# Patient Record
Sex: Female | Born: 1945 | Race: White | Hispanic: No | Marital: Married | Smoking: Never smoker
Health system: Southern US, Community
[De-identification: ages and names within clinical notes are randomized; demographics above are authoritative.]

## PROBLEM LIST (undated history)

## (undated) DIAGNOSIS — J45909 Unspecified asthma, uncomplicated: Secondary | ICD-10-CM

## (undated) DIAGNOSIS — J309 Allergic rhinitis, unspecified: Secondary | ICD-10-CM

## (undated) DIAGNOSIS — I447 Left bundle-branch block, unspecified: Secondary | ICD-10-CM

## (undated) DIAGNOSIS — G4730 Sleep apnea, unspecified: Secondary | ICD-10-CM

## (undated) DIAGNOSIS — K429 Umbilical hernia without obstruction or gangrene: Secondary | ICD-10-CM

## (undated) DIAGNOSIS — J449 Chronic obstructive pulmonary disease, unspecified: Secondary | ICD-10-CM

## (undated) DIAGNOSIS — M109 Gout, unspecified: Secondary | ICD-10-CM

## (undated) HISTORY — PX: ROOT CANAL: SHX2363

## (undated) HISTORY — PX: WISDOM TOOTH EXTRACTION: SHX21

## (undated) HISTORY — PX: BREAST LUMPECTOMY: SHX2

## (undated) HISTORY — PX: SKIN BIOPSY: SHX1

## (undated) HISTORY — PX: CARDIAC CATHETERIZATION: SHX172

## (undated) HISTORY — PX: TONSILLECTOMY: SUR1361

## (undated) HISTORY — PX: ADENOIDECTOMY: SUR15

## (undated) HISTORY — PX: CATARACT EXTRACTION, BILATERAL: SHX1313

---

## 2003-07-22 DIAGNOSIS — I447 Left bundle-branch block, unspecified: Secondary | ICD-10-CM | POA: Insufficient documentation

## 2011-02-07 ENCOUNTER — Other Ambulatory Visit: Payer: Self-pay | Admitting: Family Medicine

## 2011-02-07 ENCOUNTER — Ambulatory Visit
Admission: RE | Admit: 2011-02-07 | Discharge: 2011-02-07 | Disposition: A | Discharge status: Home / Self Care | Payer: Medicare Other | Source: Ambulatory Visit | Attending: Family Medicine | Admitting: Family Medicine

## 2011-02-07 DIAGNOSIS — R609 Edema, unspecified: Secondary | ICD-10-CM

## 2011-02-07 DIAGNOSIS — R52 Pain, unspecified: Secondary | ICD-10-CM

## 2011-07-08 DIAGNOSIS — M660 Rupture of popliteal cyst: Secondary | ICD-10-CM | POA: Insufficient documentation

## 2011-07-08 DIAGNOSIS — M79604 Pain in right leg: Secondary | ICD-10-CM | POA: Insufficient documentation

## 2011-07-13 DIAGNOSIS — L03115 Cellulitis of right lower limb: Secondary | ICD-10-CM | POA: Insufficient documentation

## 2011-07-18 DIAGNOSIS — M1990 Unspecified osteoarthritis, unspecified site: Secondary | ICD-10-CM | POA: Insufficient documentation

## 2011-08-12 DIAGNOSIS — Z01419 Encounter for gynecological examination (general) (routine) without abnormal findings: Secondary | ICD-10-CM | POA: Insufficient documentation

## 2011-08-12 DIAGNOSIS — G2581 Restless legs syndrome: Secondary | ICD-10-CM | POA: Insufficient documentation

## 2011-08-12 DIAGNOSIS — N3281 Overactive bladder: Secondary | ICD-10-CM | POA: Insufficient documentation

## 2012-07-27 DIAGNOSIS — H35379 Puckering of macula, unspecified eye: Secondary | ICD-10-CM | POA: Insufficient documentation

## 2012-07-27 DIAGNOSIS — H40019 Open angle with borderline findings, low risk, unspecified eye: Secondary | ICD-10-CM | POA: Insufficient documentation

## 2014-03-17 DIAGNOSIS — J4530 Mild persistent asthma, uncomplicated: Secondary | ICD-10-CM | POA: Insufficient documentation

## 2014-03-17 DIAGNOSIS — M109 Gout, unspecified: Secondary | ICD-10-CM | POA: Insufficient documentation

## 2014-03-17 DIAGNOSIS — E039 Hypothyroidism, unspecified: Secondary | ICD-10-CM | POA: Insufficient documentation

## 2015-12-12 DIAGNOSIS — I2510 Atherosclerotic heart disease of native coronary artery without angina pectoris: Secondary | ICD-10-CM | POA: Insufficient documentation

## 2015-12-12 DIAGNOSIS — E782 Mixed hyperlipidemia: Secondary | ICD-10-CM | POA: Insufficient documentation

## 2016-08-07 ENCOUNTER — Ambulatory Visit (INDEPENDENT_AMBULATORY_CARE_PROVIDER_SITE_OTHER): Payer: Medicare Other

## 2016-08-07 ENCOUNTER — Ambulatory Visit (INDEPENDENT_AMBULATORY_CARE_PROVIDER_SITE_OTHER): Payer: Medicare Other | Admitting: Podiatry

## 2016-08-07 ENCOUNTER — Encounter: Payer: Self-pay | Admitting: Podiatry

## 2016-08-07 DIAGNOSIS — M779 Enthesopathy, unspecified: Secondary | ICD-10-CM

## 2016-08-07 DIAGNOSIS — R52 Pain, unspecified: Secondary | ICD-10-CM | POA: Diagnosis not present

## 2016-08-07 MED ORDER — MELOXICAM 15 MG PO TABS: 15.0000 mg | ORAL_TABLET | Freq: Every day | ORAL | 0 refills | Status: DC

## 2016-08-07 NOTE — Progress Notes (Signed)
   Subjective:    Patient ID: Diana GurneySusan [name], female    DOB: October 26, 1945, 71 y.o.   MRN: [id number]  HPI this patient presents the office with chief complaint of pain on the outside ball of her left foot.  She says that she experiences severe pain on the outside of her left foot and at other times no pain is noted.  Examination of her left foot reveals no palpable pain or swelling noted, but there is a bony prominence noted at the head of the fifth metatarsal left foot.  Patient does have a medial deviation of all the digits on the left foot.  This patient also had surgery for the correction of the bunion both feet as well as hammertoes of the fifth toes both feet. She presents the office today with concern for the on and off pain that she is experiencing on the outside of her left foot    Review of Systems  HENT: Positive for hearing loss and sinus pressure.   Respiratory: Positive for cough and shortness of breath.   Cardiovascular: Positive for leg swelling.  Musculoskeletal: Positive for back pain and gait problem.       Objective:   Physical Exam GENERAL APPEARANCE: Alert, conversant. Appropriately groomed. No acute distress.  VASCULAR: Pedal pulses are  palpable at  [facility] bilateral.  Capillary refill time is immediate to all digits,  Normal temperature gradient.  Digital hair growth is present bilateral  NEUROLOGIC: sensation is normal to 5.07 monofilament at 5/5 sites bilateral.  Light touch is intact bilateral, Muscle strength normal.  MUSCULOSKELETAL: acceptable muscle strength, tone and stability bilateral.  Intrinsic muscluature intact bilateral. Medial deviation of digits  B/l. Bony prominence fifth metatarsal left foot.  DERMATOLOGIC: skin color, texture, and turgor are within normal limits.  No preulcerative lesions or ulcers  are seen, no interdigital maceration noted.  No open lesions present.  Digital nails are asymptomatic. No drainage noted.         Assessment & Plan:   Capsulitis 5th MPJ  Left foot.  S/P foot surgery  B/L.  Initial exam.  X-rays taken reveal wired noted at the base of the first metatarsal bilaterally.  Mild hallux valgus first MPJ left foot.  Medial deviation of the digits.  Discussed capsulitis with this patient.  Told her that we should prescribe her Mobic to be taken when pain is present.   Told her we can even provide  her injection therapy if the pain is severe at times.  .RTC prn.   Helane GuntherGregory Lisset Ketchem DPM

## 2016-08-09 ENCOUNTER — Encounter: Payer: Self-pay | Admitting: Podiatry

## 2016-10-30 ENCOUNTER — Ambulatory Visit: Payer: Medicare Other | Admitting: Podiatry

## 2017-06-27 ENCOUNTER — Encounter: Payer: Self-pay | Admitting: Internal Medicine

## 2017-06-27 ENCOUNTER — Ambulatory Visit (INDEPENDENT_AMBULATORY_CARE_PROVIDER_SITE_OTHER): Payer: Medicare Other | Admitting: Internal Medicine

## 2017-06-27 VITALS — BP 132/80 | HR 75 | Ht 62.0 in | Wt 210.0 lb

## 2017-06-27 DIAGNOSIS — R062 Wheezing: Secondary | ICD-10-CM

## 2017-06-27 DIAGNOSIS — R0609 Other forms of dyspnea: Secondary | ICD-10-CM

## 2017-06-27 DIAGNOSIS — R0689 Other abnormalities of breathing: Secondary | ICD-10-CM | POA: Diagnosis not present

## 2017-06-27 DIAGNOSIS — Z8669 Personal history of other diseases of the nervous system and sense organs: Secondary | ICD-10-CM

## 2017-06-27 NOTE — Progress Notes (Signed)
Subjective:     Patient ID: Diana [name], female   DOB: [birth year], 72 y.o.   MRN: [id number] PCP Eartha InchBadger, Michael C, MD  HPI   IOV  06/27/2017  Chief Complaint  Patient presents with  . Consult    Referred by Dr. Cyndia BentBadger for asthma.  Pt states she has been diagnosed with asthma in 2005 and also has allergies and was diagnosed with 1984. Pt denies any current complaints of cough, SOB, or CP. States she recently moved from [place] and needs to be established with a new Pulmonologist.   Diana [name] , 72 y.o. , with dob [birth year] and female ,Unavailable from [address] [place] [id number] - presents to pulm clinic for establishing care for asthma after moving here from [place], 72 year old female here with her husband.  She is here for evaluation of noisy breathing, shortness of breath with exertion and wheezing with a background history of asthma and allergy dating back decades.  History is given by her and by her husband and to a limited extent by review of the old chart.  And as best as I can gather she is to live in [place] up until 2012 but back in the 1970s and 1980s she used to also live in [place] and then shifted back and forth between [place] to [place].  In 1984 she was diagnosed to have significant allergies to different agents known food.  She then set up with her allergist and was on multiple allergy shots but she used to carry with her given her trip to [place].  While in [place] she would visit with Dr. Felipe DroneJos Bardelas and get shots.  In 2006 she was referred to a pulmonologist who diagnosed her with sleep apnea and started on CPAP which was then changed to BiPAP in 2010.  In 2012 she moved from [place] and he has not followed with an allergist until recently in April 2019 when she registered with Dr. Skyland CallasSharma in the interim in 2014 she did stop her allergy shots.  Most recently when she saw Dr. [place] CallasSharma allergy test was  essentially negative except 2+ for cat and since then she has been on cat allergy shots.  Her medication profile indicates she has been on theophylline for many decades which she is unwilling to quit.  She is been on Symbicort for many years and more recently since April 2019 on Singulair, cat allergy shot, Flonase and prednisone as needed.  According to her husband the main issue is her dyspnea on exertion when she climbs a flight of stairs noisy breathing and wheezing.  These are multi-decade problems with her husband feels like over time gradually they are getting better but still unresolved.  They do need to establish with a primary pulmonologist in the area and also sleep doctor and that 1 of the main purposes of the visit today.  She also has nonspecific inability to stand for a long time because her legs give out.  But she is okay walking apparently.  In terms of her asthma control questionnaire symptoms she does not wake up in the middle of the night with symptoms when she wakes up in the morning she does not have any symptoms her activities only slightly limited because of asthma.  She experience a little shortness of breath and she wheezes a moderate amount of the time and does not use albuterol for rescue.       feno -  unable to do   has no past medical history on file.   reports that she has never smoked. She has never used smokeless tobacco.  The histories are not reviewed yet. Please review them in the "History" navigator section and refresh this SmartLink.  Allergies  Allergen Reactions  . Molds & Smuts Shortness Of Breath  . Other Shortness Of Breath  . Pollen Extract Shortness Of Breath    Immunization History  Administered Date(s) Administered  . Influenza, High Dose Seasonal PF 11/04/2016  . Pneumococcal Conjugate-13 12/25/2012  . Pneumococcal Polysaccharide-23 11/22/2014    No family history on file.   Current Outpatient Medications:  .  albuterol (ACCUNEB)  1.25 MG/3ML nebulizer solution, Take 1 ampule by nebulization as needed for wheezing., Disp: , Rfl:  .  albuterol (PROVENTIL HFA;VENTOLIN HFA) 108 (90 Base) MCG/ACT inhaler, Inhale into the lungs as needed for wheezing or shortness of breath., Disp: , Rfl:  .  albuterol (PROVENTIL) (2.5 MG/3ML) 0.083% nebulizer solution, Take 3 mLs (2.5 mg total) by nebulization every 4 (four) hours as needed for Wheezing., Disp: , Rfl:  .  allopurinol (ZYLOPRIM) 300 MG tablet, , Disp: , Rfl: 3 .  aspirin EC 81 MG tablet, Take by mouth., Disp: , Rfl:  .  B Complex Vitamins (VITAMIN B COMPLEX PO), Take by mouth., Disp: , Rfl:  .  Cholecalciferol (VITAMIN D3) 1000 units CAPS, Take by mouth., Disp: , Rfl:  .  diazepam (VALIUM) 5 MG tablet, TAKE 1 TABLET BY MOUTH EVERY DAY, Disp: , Rfl:  .  diphenhydrAMINE (BENADRYL) 25 MG tablet, Take 25 mg by mouth., Disp: , Rfl:  .  EPINEPHrine 0.3 mg/0.3 mL IJ SOAJ injection, Inject into the skin., Disp: , Rfl:  .  folic acid (FOLVITE) 400 MCG tablet, Take by mouth., Disp: , Rfl:  .  furosemide (LASIX) 20 MG tablet, TK 1 T PO D, Disp: , Rfl: 0 .  levothyroxine (SYNTHROID, LEVOTHROID) 50 MCG tablet, Take 50 mcg by mouth 1 day or 1 dose., Disp: , Rfl:  .  montelukast (SINGULAIR) 10 MG tablet, TK 1 T PO QD, Disp: , Rfl: 3 .  Multiple Vitamin (MULTIVITAMIN) tablet, Take by mouth., Disp: , Rfl:  .  niacin 500 MG tablet, Take 500 mg by mouth 1 day or 1 dose., Disp: , Rfl:  .  potassium chloride SA (K-DUR,KLOR-CON) 20 MEQ tablet, TK 1 T PO QD, Disp: , Rfl: 3 .  predniSONE 5 MG/5ML solution, Take 5 mg by mouth as needed., Disp: , Rfl:  .  simvastatin (ZOCOR) 20 MG tablet, TK 1 T PO AT BEDTIME, Disp: , Rfl: 3 .  SYMBICORT 160-4.5 MCG/ACT inhaler, INL 2 PFS ITL BID, Disp: , Rfl: 12 .  theophylline (UNIPHYL) 400 MG 24 hr tablet, Take 400 mg by mouth daily., Disp: , Rfl:  .  vitamin E 1000 UNIT capsule, Take by mouth., Disp: , Rfl:    Review of Systems     Objective:   Physical Exam   Constitutional: She is oriented to person, place, and time. She appears well-developed and well-nourished. No distress.  Body mass index is 38.41 kg/m.   HENT:  Head: Normocephalic and atraumatic.  Right Ear: External ear normal.  Left Ear: External ear normal.  Mouth/Throat: Oropharynx is clear and moist. No oropharyngeal exudate.  Has hearing aids  Eyes: Pupils are equal, round, and reactive to light. Conjunctivae and EOM are normal. Right eye exhibits no discharge. Left eye exhibits no discharge. No scleral  icterus.  Neck: Normal range of motion. Neck supple. No JVD present. No tracheal deviation present. No thyromegaly present.  Cardiovascular: Normal rate, regular rhythm, normal heart sounds and intact distal pulses. Exam reveals no gallop and no friction rub.  No murmur heard. Pulmonary/Chest: Effort normal and breath sounds normal. No respiratory distress. She has no wheezes. She has no rales. She exhibits no tenderness.  Abdominal: Soft. Bowel sounds are normal. She exhibits no distension and no mass. There is no tenderness. There is no rebound and no guarding.  Visceral obesity +  Musculoskeletal: Normal range of motion. She exhibits no edema or tenderness.  Lymphadenopathy:    She has no cervical adenopathy.  Neurological: She is alert and oriented to person, place, and time. She has normal reflexes. No cranial nerve deficit. She exhibits normal muscle tone. Coordination normal.  Skin: Skin is warm and dry. No rash noted. She is not diaphoretic. No erythema. No pallor.  Psychiatric: She has a normal mood and affect. Her behavior is normal. Judgment and thought content normal.  Vitals reviewed.  Today's Vitals   06/27/17 1418  BP: 132/80  Pulse: 75  SpO2: 93%    There is no height or weight on file to calculate BMI.     Assessment:       ICD-10-CM   1. Dyspnea on exertion R06.09   2. Wheezing R06.2   3. Noisy breathing R06.89   4. History of obstructive sleep apnea  Z86.69     She is very reluctant to stop her theophylline.  Regardless I would like to approach her problems from a symptom standpoint and try to reverse engineer and work as to why she has symptoms of shortness of breath, noisy breathing and wheezing taking into account the background history of asthma and allergies.  She has had complete allergy testing recently with Dr. [place] Callas so there is no need for me to repeat this.  Differential diagnosis for shortness of breath at this stage includes obesity with physical deconditioning and diastolic dysfunction with or without asthma.  She would also have cor pulmonale although I do not see edema on account of obesity and sleep apnea.  We can begin with high-resolution CT scan and pulmonary function test and also evaluation by sleep physician.  She is willing for this.     Plan:      Dyspnea on exertion Wheezing Noisy breathing  - do HRCT supine and prone - do full PFT  History of obstructive sleep apnea - refer sleep doc in our practice or Dr Dohmeier or Dr Mayford Knife - whoever can see you soon  Followuop - Dr Marchelle Gearing or an APP in 2-8 weeks but after completing above     Dr. Kalman Shan, M.D., [facility].C.P Pulmonary and Critical Care Medicine Staff Physician, [facility] Center Director - Interstitial Lung Disease  Program  Pulmonary Fibrosis [facility] at [facility] [place], [place], [address]  Pager: [phone], If no answer or between  15:00h - 7:00h: call [phone] Telephone: [phone]

## 2017-06-27 NOTE — Progress Notes (Signed)
   Subjective:    Patient ID: Diana GurneySusan [name], female    DOB: [birth year], 72 y.o.   MRN: [id number]  HPI    Review of Systems  Constitutional: Negative for fever and unexpected weight change.  HENT: Positive for sneezing and sore throat. Negative for congestion, dental problem, ear pain, nosebleeds, postnasal drip, rhinorrhea and trouble swallowing.   Eyes: Negative for redness and itching.  Respiratory: Positive for cough, shortness of breath and wheezing. Negative for chest tightness.   Cardiovascular: Positive for leg swelling. Negative for palpitations.  Gastrointestinal: Negative for nausea and vomiting.  Genitourinary: Negative for dysuria.  Musculoskeletal: Positive for joint swelling.  Skin: Negative for rash.  Allergic/Immunologic: Positive for environmental allergies. Negative for food allergies and immunocompromised state.  Neurological: Negative for headaches.  Hematological: Does not bruise/bleed easily.  Psychiatric/Behavioral: Negative for dysphoric mood. The patient is not nervous/anxious.        Objective:   Physical Exam        Assessment & Plan:

## 2017-06-27 NOTE — Patient Instructions (Signed)
ICD-10-CM   1. Dyspnea on exertion R06.09   2. Wheezing R06.2   3. Noisy breathing R06.89   4. History of obstructive sleep apnea Z86.69      Dyspnea on exertion Wheezing Noisy breathing  - do HRCT supine and prone - do full PFT  History of obstructive sleep apnea - refer sleep doc in our practice or Dr Dohmeier or Dr Mayford Knifeurner - whoever can see you soon  Followuop - Dr Marchelle Gearingamaswamy or an APP in 2-8 weeks but after completing above

## 2017-06-29 ENCOUNTER — Encounter: Payer: Self-pay | Admitting: Internal Medicine

## 2017-06-30 ENCOUNTER — Ambulatory Visit (INDEPENDENT_AMBULATORY_CARE_PROVIDER_SITE_OTHER): Payer: Medicare Other | Admitting: Internal Medicine

## 2017-06-30 ENCOUNTER — Encounter: Payer: Self-pay | Admitting: Internal Medicine

## 2017-06-30 VITALS — BP 126/80 | HR 65 | Ht 62.0 in | Wt 211.0 lb

## 2017-06-30 DIAGNOSIS — F5101 Primary insomnia: Secondary | ICD-10-CM

## 2017-06-30 DIAGNOSIS — G4733 Obstructive sleep apnea (adult) (pediatric): Secondary | ICD-10-CM | POA: Diagnosis not present

## 2017-06-30 DIAGNOSIS — G4700 Insomnia, unspecified: Secondary | ICD-10-CM | POA: Insufficient documentation

## 2017-06-30 NOTE — Patient Instructions (Signed)
Order- schedule split protocol NPSG with CPAP/BIPAP titration    Dx OSA  Please call as needed

## 2017-06-30 NOTE — Progress Notes (Signed)
HPI  06/30/2017-72 year old female never smoker for sleep evaluation.  Medical problem list includes asthma( Dr Marchelle Gearing), allergic rhinitis( Dr Ingram Callas). ----Sleep consult: Pt needs to establish with sleep MD and DME locally-moved here from [place]. DL attached.  Regional sleep study was in 2006 with subsequent study in 2011 and current machine was new in 2011.  She is very comfortable with BiPAP I 10,  E6, Biflex 3.  Getting supplies from [employer] and using a nasal mask and humidifier. Download 100% compliance AHI 3.1/hour. ENT surgery-tonsils.  Prior to Admission medications   Medication Sig Start Date End Date Taking? Authorizing Provider  albuterol (ACCUNEB) 1.25 MG/3ML nebulizer solution Take 1 ampule by nebulization as needed for wheezing.   Yes [provider]  albuterol (PROVENTIL HFA;VENTOLIN HFA) 108 (90 Base) MCG/ACT inhaler Inhale into the lungs as needed for wheezing or shortness of breath.   Yes [provider]  albuterol (PROVENTIL) (2.5 MG/3ML) 0.083% nebulizer solution Take 3 mLs (2.5 mg total) by nebulization every 4 (four) hours as needed for Wheezing. 06/03/16  Yes [provider]  allopurinol (ZYLOPRIM) 300 MG tablet  06/07/16  Yes [provider]  aspirin EC 81 MG tablet Take by mouth.   Yes [provider]  B Complex Vitamins (VITAMIN B COMPLEX PO) Take by mouth.   Yes [provider]  Cholecalciferol (VITAMIN D3) 1000 units CAPS Take by mouth.   Yes [provider]  diazepam (VALIUM) 5 MG tablet TAKE 1 TABLET BY MOUTH EVERY DAY 06/03/16  Yes [provider]  diphenhydrAMINE (BENADRYL) 25 MG tablet Take 25 mg by mouth.   Yes [provider]  EPINEPHrine 0.3 mg/0.3 mL IJ SOAJ injection Inject into the skin. 01/23/16  Yes [provider]  folic acid (FOLVITE) 400 MCG tablet Take by mouth.   Yes [provider]  furosemide (LASIX) 20 MG tablet TK 1 T PO D 06/03/16  Yes [provider]  levothyroxine (SYNTHROID, LEVOTHROID) 50 MCG tablet Take 50 mcg by mouth 1 day or 1 dose.   Yes [provider]  montelukast (SINGULAIR) 10 MG tablet TK 1 T PO QD 05/14/17  Yes [provider]  Multiple Vitamin (MULTIVITAMIN) tablet Take by mouth.   Yes [provider]  niacin 500 MG tablet Take 500 mg by mouth 1 day or 1 dose.   Yes [provider]  potassium chloride SA (K-DUR,KLOR-CON) 20 MEQ tablet TK 1 T PO QD 06/02/16  Yes [provider]  predniSONE 5 MG/5ML solution Take 5 mg by mouth as needed.   Yes [provider]  simvastatin (ZOCOR) 20 MG tablet TK 1 T PO AT BEDTIME 06/16/16  Yes [provider]  SYMBICORT 160-4.5 MCG/ACT inhaler INL 2 PFS ITL BID 07/09/16  Yes [provider]  theophylline (UNIPHYL) 400 MG 24 hr tablet Take 400 mg by mouth daily.   Yes [provider]  vitamin E 1000 UNIT capsule Take by mouth.   Yes [provider]   History reviewed. No pertinent past medical history. History reviewed. No pertinent surgical history. History reviewed. No pertinent family history. Social History   Socioeconomic History  . Marital status: Married    Spouse name: Not on file  . Number of children: Not on file  . Years of education: Not on file  . Highest education level: Not on file  Occupational History  . Not on file  Social Needs  . Financial resource strain: Not on file  .  Food insecurity:    Worry: Not on file    Inability: Not on file  . Transportation needs:    Medical: Not on file    Non-medical: Not on file  Tobacco Use  . Smoking status: Never Smoker  . Smokeless tobacco: Never Used  Substance and Sexual Activity  . Alcohol use: No  . Drug use: No  . Sexual activity: Not on file  Lifestyle  . Physical activity:    Days per week: Not on file    Minutes per session: Not on file  . Stress: Not on file  Relationships  . Social connections:    Talks on  phone: Not on file    Gets together: Not on file    Attends religious service: Not on file    Active member of club or organization: Not on file    Attends meetings of clubs or organizations: Not on file    Relationship status: Not on file  . Intimate partner violence:    Fear of current or ex partner: Not on file    Emotionally abused: Not on file    Physically abused: Not on file    Forced sexual activity: Not on file  Other Topics Concern  . Not on file  Social History Narrative  . Not on file   ROS-see HPI   + = positive Constitutional:    weight loss, night sweats, fevers, chills, fatigue, lassitude. HEENT:    headaches, difficulty swallowing, tooth/dental problems, sore throat,       sneezing, itching, ear ache, nasal congestion, post nasal drip, snoring CV:    chest pain, orthopnea, PND, swelling in lower extremities, anasarca,                                                      dizziness, palpitations Resp:   shortness of breath with exertion or at rest.                productive cough,   non-productive cough, coughing up of blood.              change in color of mucus.  wheezing.   Skin:    rash or lesions. GI:  No-   heartburn, indigestion, abdominal pain, nausea, vomiting, diarrhea,                 change in bowel habits, loss of appetite GU: dysuria, change in color of urine, no urgency or frequency.   flank pain. MS:   joint pain, stiffness, decreased range of motion, back pain. Neuro-     nothing unusual Psych:  change in mood or affect.  depression or anxiety.   memory loss.  OBJ- Physical Exam General- Alert, Oriented, Affect-appropriate, Distress- none acute, + obese Skin- rash-none, lesions- none, excoriation- none Lymphadenopathy- none Head- atraumatic            Eyes- Gross vision intact, PERRLA, conjunctivae and secretions clear            Ears-+ bilateral hearing aids            Nose- Clear, no-Septal dev, mucus, polyps, erosion, perforation              Throat- Mallampati IV , mucosa clear , drainage- none, tonsils- atrophic Neck- flexible , trachea midline, no stridor ,  thyroid nl, carotid no bruit Chest - symmetrical excursion , unlabored           Heart/CV- RRR , no murmur , no gallop  , no rub, nl s1 s2                           - JVD- none , edema- none, stasis changes- none, varices- none           Lung- clear to P&A, wheeze- none, cough- none , dullness-none, rub- none           Chest wall-  Abd-  Br/ Gen/ Rectal- Not done, not indicated Extrem- cyanosis- none, clubbing, none, atrophy- none, strength- nl Neuro- grossly intact to observation

## 2017-06-30 NOTE — Assessment & Plan Note (Signed)
She has benefited from BiPAP since 2011-current machine still working well.  We are going to update sleep study for current documentation and will requalify for CPAP/BiPAP as appropriate.  She will continue current BiPAP 10/6 pending results of new sleep study.

## 2017-06-30 NOTE — Assessment & Plan Note (Signed)
She has continued diazepam and an OTC sleep med for years and feels she sleeps comfortably with these.  Sleep habits discussed.  If she notes that she stays on stimulant bronchodilators including theophylline.

## 2017-06-30 NOTE — Progress Notes (Deleted)
Subjective:     Patient ID: Diana [name], female   DOB: [birth year], 72 y.o.   MRN: [id number] PCP Eartha InchBadger, Michael C, MD  HPI   IOV  06/27/2017  Chief Complaint  Patient presents with  . Consult    Referred by Dr. Cyndia BentBadger for asthma.  Pt states she has been diagnosed with asthma in 2005 and also has allergies and was diagnosed with 1984. Pt denies any current complaints of cough, SOB, or CP. States she recently moved from [place] and needs to be established with a new Pulmonologist.   Diana [name] , 72 y.o. , with dob [birth year] and female ,Unavailable from [address] [place] [id number] - presents to pulm clinic for establishing care for asthma after moving here from [place], 72 year old female here with her husband.  She is here for evaluation of noisy breathing, shortness of breath with exertion and wheezing with a background history of asthma and allergy dating back decades.  History is given by her and by her husband and to a limited extent by review of the old chart.  And as best as I can gather she is to live in [place] up until 2012 but back in the 1970s and 1980s she used to also live in [place] and then shifted back and forth between [place] to [place].  In 1984 she was diagnosed to have significant allergies to different agents known food.  She then set up with her allergist and was on multiple allergy shots but she used to carry with her given her trip to [place].  While in [place] she would visit with Dr. Felipe DroneJos Bardelas and get shots.  In 2006 she was referred to a pulmonologist who diagnosed her with sleep apnea and started on CPAP which was then changed to BiPAP in 2010.  In 2012 she moved from [place] to [place] and he has not followed with an allergist until recently in April 2019 when she registered with Dr. Skyland CallasSharma in the interim in 2014 she did stop her allergy shots.  Most recently when she saw Dr. [place] CallasSharma allergy test was  essentially negative except 2+ for cat and since then she has been on cat allergy shots.  Her medication profile indicates she has been on theophylline for many decades which she is unwilling to quit.  She is been on Symbicort for many years and more recently since April 2019 on Singulair, cat allergy shot, Flonase and prednisone as needed.  According to her husband the main issue is her dyspnea on exertion when she climbs a flight of stairs noisy breathing and wheezing.  These are multi-decade problems with her husband feels like over time gradually they are getting better but still unresolved.  They do need to establish with a primary pulmonologist in the area and also sleep doctor and that 1 of the main purposes of the visit today.  She also has nonspecific inability to stand for a long time because her legs give out.  But she is okay walking apparently.  In terms of her asthma control questionnaire symptoms she does not wake up in the middle of the night with symptoms when she wakes up in the morning she does not have any symptoms her activities only slightly limited because of asthma.  She experience a little shortness of breath and she wheezes a moderate amount of the time and does not use albuterol for rescue.       feno -  unable to do   has no past medical history on file.   reports that she has never smoked. She has never used smokeless tobacco.  The histories are not reviewed yet. Please review them in the "History" navigator section and refresh this SmartLink.  Allergies  Allergen Reactions  . Molds & Smuts Shortness Of Breath  . Other Shortness Of Breath  . Pollen Extract Shortness Of Breath    Immunization History  Administered Date(s) Administered  . Influenza, High Dose Seasonal PF 11/04/2016  . Pneumococcal Conjugate-13 12/25/2012  . Pneumococcal Polysaccharide-23 11/22/2014    No family history on file.   Current Outpatient Medications:  .  albuterol (ACCUNEB)  1.25 MG/3ML nebulizer solution, Take 1 ampule by nebulization as needed for wheezing., Disp: , Rfl:  .  albuterol (PROVENTIL HFA;VENTOLIN HFA) 108 (90 Base) MCG/ACT inhaler, Inhale into the lungs as needed for wheezing or shortness of breath., Disp: , Rfl:  .  albuterol (PROVENTIL) (2.5 MG/3ML) 0.083% nebulizer solution, Take 3 mLs (2.5 mg total) by nebulization every 4 (four) hours as needed for Wheezing., Disp: , Rfl:  .  allopurinol (ZYLOPRIM) 300 MG tablet, , Disp: , Rfl: 3 .  aspirin EC 81 MG tablet, Take by mouth., Disp: , Rfl:  .  B Complex Vitamins (VITAMIN B COMPLEX PO), Take by mouth., Disp: , Rfl:  .  Cholecalciferol (VITAMIN D3) 1000 units CAPS, Take by mouth., Disp: , Rfl:  .  diazepam (VALIUM) 5 MG tablet, TAKE 1 TABLET BY MOUTH EVERY DAY, Disp: , Rfl:  .  diphenhydrAMINE (BENADRYL) 25 MG tablet, Take 25 mg by mouth., Disp: , Rfl:  .  EPINEPHrine 0.3 mg/0.3 mL IJ SOAJ injection, Inject into the skin., Disp: , Rfl:  .  folic acid (FOLVITE) 400 MCG tablet, Take by mouth., Disp: , Rfl:  .  furosemide (LASIX) 20 MG tablet, TK 1 T PO D, Disp: , Rfl: 0 .  levothyroxine (SYNTHROID, LEVOTHROID) 50 MCG tablet, Take 50 mcg by mouth 1 day or 1 dose., Disp: , Rfl:  .  montelukast (SINGULAIR) 10 MG tablet, TK 1 T PO QD, Disp: , Rfl: 3 .  Multiple Vitamin (MULTIVITAMIN) tablet, Take by mouth., Disp: , Rfl:  .  niacin 500 MG tablet, Take 500 mg by mouth 1 day or 1 dose., Disp: , Rfl:  .  potassium chloride SA (K-DUR,KLOR-CON) 20 MEQ tablet, TK 1 T PO QD, Disp: , Rfl: 3 .  predniSONE 5 MG/5ML solution, Take 5 mg by mouth as needed., Disp: , Rfl:  .  simvastatin (ZOCOR) 20 MG tablet, TK 1 T PO AT BEDTIME, Disp: , Rfl: 3 .  SYMBICORT 160-4.5 MCG/ACT inhaler, INL 2 PFS ITL BID, Disp: , Rfl: 12 .  theophylline (UNIPHYL) 400 MG 24 hr tablet, Take 400 mg by mouth daily., Disp: , Rfl:  .  vitamin E 1000 UNIT capsule, Take by mouth., Disp: , Rfl:    Review of Systems     Objective:   Physical Exam   Constitutional: She is oriented to person, place, and time. She appears well-developed and well-nourished. No distress.  Body mass index is 38.41 kg/m.   HENT:  Head: Normocephalic and atraumatic.  Right Ear: External ear normal.  Left Ear: External ear normal.  Mouth/Throat: Oropharynx is clear and moist. No oropharyngeal exudate.  Has hearing aids  Eyes: Pupils are equal, round, and reactive to light. Conjunctivae and EOM are normal. Right eye exhibits no discharge. Left eye exhibits no discharge. No scleral  icterus.  Neck: Normal range of motion. Neck supple. No JVD present. No tracheal deviation present. No thyromegaly present.  Cardiovascular: Normal rate, regular rhythm, normal heart sounds and intact distal pulses. Exam reveals no gallop and no friction rub.  No murmur heard. Pulmonary/Chest: Effort normal and breath sounds normal. No respiratory distress. She has no wheezes. She has no rales. She exhibits no tenderness.  Abdominal: Soft. Bowel sounds are normal. She exhibits no distension and no mass. There is no tenderness. There is no rebound and no guarding.  Visceral obesity +  Musculoskeletal: Normal range of motion. She exhibits no edema or tenderness.  Lymphadenopathy:    She has no cervical adenopathy.  Neurological: She is alert and oriented to person, place, and time. She has normal reflexes. No cranial nerve deficit. She exhibits normal muscle tone. Coordination normal.  Skin: Skin is warm and dry. No rash noted. She is not diaphoretic. No erythema. No pallor.  Psychiatric: She has a normal mood and affect. Her behavior is normal. Judgment and thought content normal.  Vitals reviewed.  Today's Vitals   06/27/17 1418  BP: 132/80  Pulse: 75  SpO2: 93%    There is no height or weight on file to calculate BMI.     Assessment:       ICD-10-CM   1. Dyspnea on exertion R06.09   2. Wheezing R06.2   3. Noisy breathing R06.89   4. History of obstructive sleep apnea  Z86.69     She is very reluctant to stop her theophylline.  Regardless I would like to approach her problems from a symptom standpoint and try to reverse engineer and work as to why she has symptoms of shortness of breath, noisy breathing and wheezing taking into account the background history of asthma and allergies.  She has had complete allergy testing recently with Dr. [place] Callas so there is no need for me to repeat this.  Differential diagnosis for shortness of breath at this stage includes obesity with physical deconditioning and diastolic dysfunction with or without asthma.  She would also have cor pulmonale although I do not see edema on account of obesity and sleep apnea.  We can begin with high-resolution CT scan and pulmonary function test and also evaluation by sleep physician.  She is willing for this.     Plan:      Dyspnea on exertion Wheezing Noisy breathing  - do HRCT supine and prone - do full PFT  History of obstructive sleep apnea - refer sleep doc in our practice or Dr Dohmeier or Dr Mayford Knife - whoever can see you soon  Followuop - Dr Marchelle Gearing or an APP in 2-8 weeks but after completing above     Dr. Kalman Shan, M.D., [facility].C.P Pulmonary and Critical Care Medicine Staff Physician, [facility] Director - Interstitial Lung Disease  Program  Pulmonary Fibrosis [facility] at [facility] [place], [place], [address]  Pager: [phone], If no answer or between  15:00h - 7:00h: call [phone] Telephone: [phone]   HPI  06/30/2017-72 year old female never smoker for sleep evaluation. Medical problems include mild persistent asthma, CAD/LBBB, OSA, glaucoma,

## 2017-07-08 ENCOUNTER — Ambulatory Visit (INDEPENDENT_AMBULATORY_CARE_PROVIDER_SITE_OTHER)
Admission: RE | Admit: 2017-07-08 | Discharge: 2017-07-08 | Disposition: A | Discharge status: Home / Self Care | Payer: Medicare Other | Source: Ambulatory Visit | Attending: Internal Medicine | Admitting: Internal Medicine

## 2017-07-08 DIAGNOSIS — R0609 Other forms of dyspnea: Secondary | ICD-10-CM | POA: Diagnosis not present

## 2017-07-10 ENCOUNTER — Telehealth: Payer: Self-pay | Admitting: Internal Medicine

## 2017-07-10 NOTE — Telephone Encounter (Signed)
Let Diana GurneySusan [name] know no ILD or cancer on CT chest but she has  - ver smal lung nodules - needs repeat ct chest in 1year   -  does have coronary artery calcification and if no normal cardiac stress test past few years; please refer to cardiologist - [facility] or Dr Jacinto HalimGanji, first available    Ct Chest High Resolution  Result Date: 07/09/2017 CLINICAL DATA:  72 year old female with history of chronic shortness of breath. EXAM: CT CHEST WITHOUT CONTRAST TECHNIQUE: Multidetector CT imaging of the chest was performed following the standard protocol without intravenous contrast. High resolution imaging of the lungs, as well as inspiratory and expiratory imaging, was performed. COMPARISON:  No priors. FINDINGS: Cardiovascular: Heart size is borderline enlarged. There is no significant pericardial fluid, thickening or pericardial calcification. There is aortic atherosclerosis, as well as atherosclerosis of the great vessels of the mediastinum and the coronary arteries, including calcified atherosclerotic plaque in the left anterior descending, left circumflex and right coronary arteries. Mediastinum/Nodes: No pathologically enlarged mediastinal or hilar lymph nodes. Please note that accurate exclusion of hilar adenopathy is limited on noncontrast CT scans. Esophagus is unremarkable in appearance. No axillary lymphadenopathy. Lungs/Pleura: High-resolution images demonstrate no significant regions of ground-glass attenuation, subpleural reticulation, parenchymal banding, traction bronchiectasis or frank honeycombing. Inspiratory and expiratory imaging is unremarkable. No acute consolidative airspace disease. No pleural effusions. A few scattered subpleural pulmonary nodules measuring 5 mm or less in size are noted. Small calcified granuloma in the left lower lobe. No larger more suspicious appearing pulmonary nodules or masses are noted. Upper Abdomen: Diffuse low attenuation noted throughout the visualized portions of  the hepatic parenchyma, indicative of hepatic steatosis. Aortic atherosclerosis. Musculoskeletal: There are no aggressive appearing lytic or blastic lesions noted in the visualized portions of the skeleton. IMPRESSION: 1. No evidence of interstitial lung disease. 2. No acute findings are noted in the thorax. 3. Small pulmonary nodules measuring 5 mm or less in size. These findings are nonspecific but are statistically likely benign. No follow-up needed if patient is low-risk (and has no known or suspected primary neoplasm). Non-contrast chest CT can be considered in 12 months if patient is high-risk. This recommendation follows the consensus statement: Guidelines for Management of Incidental Pulmonary Nodules Detected on CT Images: From the Fleischner Society 2017; Radiology 2017; 284:228-243. 4. Aortic atherosclerosis, in addition to 3 vessel coronary artery disease. Please note that although the presence of coronary artery calcium documents the presence of coronary artery disease, the severity of this disease and any potential stenosis cannot be assessed on this non-gated CT examination. Assessment for potential risk factor modification, dietary therapy or pharmacologic therapy may be warranted, if clinically indicated. 5. Hepatic steatosis. Aortic Atherosclerosis (ICD10-I70.0). Electronically Signed   By: Trudie Reedaniel  Entrikin M.D.   On: 07/09/2017 09:17

## 2017-07-11 NOTE — Telephone Encounter (Signed)
Patient made aware of results and recs made by MR. She state she does have a Development worker, international aidCardiologist and will follow up with them at her next appointment in July.

## 2017-07-21 ENCOUNTER — Ambulatory Visit (HOSPITAL_BASED_OUTPATIENT_CLINIC_OR_DEPARTMENT_OTHER): Payer: Medicare Other | Attending: Internal Medicine | Admitting: Internal Medicine

## 2017-07-21 VITALS — Ht 62.0 in | Wt 200.0 lb

## 2017-07-21 DIAGNOSIS — Z79899 Other long term (current) drug therapy: Secondary | ICD-10-CM | POA: Diagnosis not present

## 2017-07-21 DIAGNOSIS — Z6838 Body mass index (BMI) 38.0-38.9, adult: Secondary | ICD-10-CM | POA: Insufficient documentation

## 2017-07-21 DIAGNOSIS — R0683 Snoring: Secondary | ICD-10-CM | POA: Insufficient documentation

## 2017-07-21 DIAGNOSIS — J449 Chronic obstructive pulmonary disease, unspecified: Secondary | ICD-10-CM | POA: Diagnosis not present

## 2017-07-21 DIAGNOSIS — Z7951 Long term (current) use of inhaled steroids: Secondary | ICD-10-CM | POA: Insufficient documentation

## 2017-07-21 DIAGNOSIS — G4761 Periodic limb movement disorder: Secondary | ICD-10-CM | POA: Diagnosis not present

## 2017-07-21 DIAGNOSIS — G4733 Obstructive sleep apnea (adult) (pediatric): Secondary | ICD-10-CM | POA: Insufficient documentation

## 2017-07-21 DIAGNOSIS — E669 Obesity, unspecified: Secondary | ICD-10-CM | POA: Insufficient documentation

## 2017-07-21 DIAGNOSIS — Z79890 Hormone replacement therapy (postmenopausal): Secondary | ICD-10-CM | POA: Insufficient documentation

## 2017-07-27 ENCOUNTER — Encounter (HOSPITAL_COMMUNITY): Payer: Self-pay

## 2017-07-27 ENCOUNTER — Emergency Department (HOSPITAL_COMMUNITY)
Admission: EM | Admit: 2017-07-27 | Discharge: 2017-07-27 | Disposition: A | Discharge status: Home / Self Care | Payer: Medicare Other | Attending: Emergency Medicine | Admitting: Emergency Medicine

## 2017-07-27 ENCOUNTER — Other Ambulatory Visit: Payer: Self-pay

## 2017-07-27 ENCOUNTER — Emergency Department (HOSPITAL_COMMUNITY): Payer: Medicare Other

## 2017-07-27 DIAGNOSIS — I447 Left bundle-branch block, unspecified: Secondary | ICD-10-CM | POA: Insufficient documentation

## 2017-07-27 DIAGNOSIS — J449 Chronic obstructive pulmonary disease, unspecified: Secondary | ICD-10-CM | POA: Insufficient documentation

## 2017-07-27 DIAGNOSIS — R531 Weakness: Secondary | ICD-10-CM

## 2017-07-27 DIAGNOSIS — Z7982 Long term (current) use of aspirin: Secondary | ICD-10-CM | POA: Diagnosis not present

## 2017-07-27 DIAGNOSIS — Z79899 Other long term (current) drug therapy: Secondary | ICD-10-CM | POA: Diagnosis not present

## 2017-07-27 DIAGNOSIS — R42 Dizziness and giddiness: Secondary | ICD-10-CM | POA: Insufficient documentation

## 2017-07-27 DIAGNOSIS — G4733 Obstructive sleep apnea (adult) (pediatric): Secondary | ICD-10-CM

## 2017-07-27 HISTORY — DX: Allergic rhinitis, unspecified: J30.9

## 2017-07-27 HISTORY — DX: Gout, unspecified: M10.9

## 2017-07-27 HISTORY — DX: Unspecified asthma, uncomplicated: J45.909

## 2017-07-27 HISTORY — DX: Umbilical hernia without obstruction or gangrene: K42.9

## 2017-07-27 HISTORY — DX: Chronic obstructive pulmonary disease, unspecified: J44.9

## 2017-07-27 HISTORY — DX: Sleep apnea, unspecified: G47.30

## 2017-07-27 HISTORY — DX: Left bundle-branch block, unspecified: I44.7

## 2017-07-27 LAB — COMPREHENSIVE METABOLIC PANEL
ALBUMIN: 3.8 g/dL (ref 3.5–5.0)
ALT: 30 U/L (ref 0–44)
ANION GAP: 10 (ref 5–15)
AST: 25 U/L (ref 15–41)
Alkaline Phosphatase: 75 U/L (ref 38–126)
BILIRUBIN TOTAL: 0.8 mg/dL (ref 0.3–1.2)
BUN: 17 mg/dL (ref 8–23)
CALCIUM: 9.7 mg/dL (ref 8.9–10.3)
CO2: 27 mmol/L (ref 22–32)
CREATININE: 0.82 mg/dL (ref 0.44–1.00)
Chloride: 104 mmol/L (ref 98–111)
GFR calc non Af Amer: >60 mL/min (ref >60)
Glucose, Bld: 152 mg/dL — ABNORMAL HIGH (ref 70–99)
Potassium: 4.4 mmol/L (ref 3.5–5.1)
SODIUM: 141 mmol/L (ref 135–145)
Total Protein: 6 g/dL — ABNORMAL LOW (ref 6.5–8.1)

## 2017-07-27 LAB — CBC WITH DIFFERENTIAL/PLATELET
ABS IMMATURE GRANULOCYTES: 0 10*3/uL (ref 0.0–0.1)
BASOS PCT: 1 %
Basophils Absolute: 0.1 10*3/uL (ref 0.0–0.1)
Eosinophils Absolute: 0.1 10*3/uL (ref 0.0–0.7)
Eosinophils Relative: 2 %
HCT: 45.5 % (ref 36.0–46.0)
HEMOGLOBIN: 15.1 g/dL — AB (ref 12.0–15.0)
IMMATURE GRANULOCYTES: 0 %
LYMPHS PCT: 19 %
Lymphs Abs: 1.4 10*3/uL (ref 0.7–4.0)
MCH: 34.6 pg — ABNORMAL HIGH (ref 26.0–34.0)
MCHC: 33.2 g/dL (ref 30.0–36.0)
MCV: 104.4 fL — ABNORMAL HIGH (ref 78.0–100.0)
MONO ABS: 0.7 10*3/uL (ref 0.1–1.0)
Monocytes Relative: 10 %
NEUTROS ABS: 5 10*3/uL (ref 1.7–7.7)
NEUTROS PCT: 68 %
PLATELETS: 224 10*3/uL (ref 150–400)
RBC: 4.36 MIL/uL (ref 3.87–5.11)
RDW: 13 % (ref 11.5–15.5)
WBC: 7.3 10*3/uL (ref 4.0–10.5)

## 2017-07-27 LAB — BRAIN NATRIURETIC PEPTIDE: B NATRIURETIC PEPTIDE 5: 90.8 pg/mL (ref 0.0–100.0)

## 2017-07-27 LAB — TROPONIN I

## 2017-07-27 NOTE — Procedures (Signed)
   Patient Name: Diana [name], Diana [name] Date: 07/21/2017 Gender: Female D.O.B: Oct 30, 1945 Age (years): [age] Referring Provider: Jetty Duhamellinton Latondra Gebhart MD, ABSM Height (inches): 62 Interpreting Physician: Jetty Duhamellinton Bladimir Auman MD, ABSM Weight (lbs): 210 RPSGT: Lowry RamMckinney, Takeya BMI: 38 MRN: [id number] CLINICAL INFORMATION Sleep [name] Type: NPSG  Indication for sleep [name]: COPD, Obesity, Snoring  Epworth Sleepiness Score: 5  SLEEP [name] TECHNIQUE As per the AASM Manual for the Scoring of Sleep and Associated Events v2.3 (April 2016) with a hypopnea requiring 4% desaturations.  The channels recorded and monitored were frontal, central and occipital EEG, electrooculogram (EOG), submentalis EMG (chin), nasal and oral airflow, thoracic and abdominal wall motion, anterior tibialis EMG, snore microphone, electrocardiogram, and pulse oximetry.  MEDICATIONS Medications self-administered by patient taken the night of the [name] : LASIX, SYNTHROID, SINGULAIR, NIACIN, SIMVASTATIN, SYMBICORT, DAILY MULTIPLE VITAMIN, FOLIC ACID, OMEGA-3 (EPA-DHA), VITAMIN D, VITAMIN E, VITAMIN B COMPLEX, DHEA, CALCIUM-MAGNESIUM  SLEEP ARCHITECTURE The [name] was initiated at 10:44:55 PM and ended at 4:54:20 AM.  Sleep onset time was 11.7 minutes and the sleep efficiency was 55.6%%. The total sleep time was 205.5 minutes.  Stage REM latency was N/A minutes.  The patient spent 13.9%% of the night in stage N1 sleep, 86.1%% in stage N2 sleep, 0.0%% in stage N3 and 0.00% in REM.  Alpha intrusion was absent.  Supine sleep was 0.00%.  RESPIRATORY PARAMETERS The overall apnea/hypopnea index (AHI) was 15.2 per hour. There were 0 total apneas, including 0 obstructive, 0 central and 0 mixed apneas. There were 52 hypopneas and 22 RERAs.  The AHI during Stage REM sleep was N/A per hour.  AHI while supine was N/A per hour.  The mean oxygen saturation was 90.7%. The minimum SpO2 during sleep was 84.0%.  soft snoring was noted during  this [name].  CARDIAC DATA The 2 lead EKG demonstrated sinus rhythm. The mean heart rate was 73.6 beats per minute. Other EKG findings include: None.  LEG MOVEMENT DATA The total PLMS were 64 with a resulting PLMS index of 18.7. Associated arousal with leg movement index was 3.5/ hr .  IMPRESSIONS - Moderate obstructive sleep apnea occurred during this [name] (AHI = 15.2/h). - Patient had difficulty initiating and maintaining sleep. Insufficient early events to meet protocol requirement for split CPAP titration. - No significant central sleep apnea occurred during this [name] (CAI = 0.0/h). - Mild oxygen desaturation was noted during this [name] (Min O2 = 84.0%, Mean 90.7%). - The patient snored with soft snoring volume. - No cardiac abnormalities were noted during this [name]. - Mild Periodic Limb Movement with arousal  DIAGNOSIS - Obstructive Sleep Apnea (327.23 [G47.33 ICD-10]) - Periodic Limb Movement Sleep Disorder  RECOMMENDATIONS - Suggest CPAP titration sleep [name] or DME autopap - Be careful with alcohol, sedatives and other CNS depressants that may worsen sleep apnea and disrupt normal sleep architecture. - Sleep hygiene should be reviewed to assess factors that may improve sleep quality. - Weight management and regular exercise should be initiated or continued if appropriate.  [Electronically signed] 07/27/2017 12:09 PM  Jetty Duhamellinton Druanne Bosques MD, ABSM Diplomate, American Board of Sleep Medicine   NPI: [id number]                          Jetty Duhamellinton Shira Bobst Diplomate, American Board of Sleep Medicine  ELECTRONICALLY SIGNED ON:  07/27/2017, 12:02 PM [facility] PH: [phone]   FX: [phone] ACCREDITED BY THE AMERICAN ACADEMY OF SLEEP MEDICINE

## 2017-07-27 NOTE — ED Notes (Signed)
Patient verbalizes understanding of discharge instructions. Opportunity for questioning and answers were provided. Armband removed by staff, pt discharged from ED.  

## 2017-07-27 NOTE — Discharge Instructions (Addendum)
As discussed, your evaluation today has been largely reassuring.  But, it is important that you monitor your condition carefully, and do not hesitate to return to the ED if you develop new, or concerning changes in your condition.  Otherwise, please follow-up with your physician for appropriate ongoing care.  Please be sure to discuss the pending theophylline level result.

## 2017-07-27 NOTE — ED Provider Notes (Signed)
[facility] EMERGENCY DEPARTMENT Provider Note   CSN: [id number] Arrival date & time: 07/27/17  1512     History   Chief Complaint Chief Complaint  Patient presents with  . Weakness    HPI Diana [name] is a 72 y.o. female.  HPI Presents with concern of weakness. She has multiple medical issues, including COPD, with chronic oxygen use. She notes that over the past 2 or 3 days she has been more weak than usual, without focal deficiency. She also denies new pain. No nausea, vomiting, diarrhea.  No fever, no chills, no cough. No clear alleviating or exacerbating factors. Past Medical History:  Diagnosis Date  . Allergic rhinitis   . Asthma   . COPD (chronic obstructive pulmonary disease) (HCC)   . Gout   . Left bundle branch block   . Sleep apnea   . Umbilical hernia     Patient Active Problem List   Diagnosis Date Noted  . Obstructive sleep apnea 06/30/2017  . Insomnia 06/30/2017    Past Surgical History:  Procedure Laterality Date  . ADENOIDECTOMY    . BREAST LUMPECTOMY Right   . CARDIAC CATHETERIZATION    . CATARACT EXTRACTION, BILATERAL    . ROOT CANAL    . SKIN BIOPSY    . TONSILLECTOMY    . WISDOM TOOTH EXTRACTION       OB History   None      Home Medications    Prior to Admission medications   Medication Sig Start Date End Date Taking? Authorizing Provider  albuterol (PROVENTIL HFA;VENTOLIN HFA) 108 (90 Base) MCG/ACT inhaler Inhale 2 puffs into the lungs every 6 (six) hours as needed for wheezing or shortness of breath.   Yes [provider]  allopurinol (ZYLOPRIM) 300 MG tablet Take 300 mg by mouth daily.   Yes [provider]  aspirin EC 81 MG tablet Take 81 mg by mouth daily.   Yes [provider]  budesonide-formoterol (SYMBICORT) 160-4.5 MCG/ACT inhaler Inhale 2 puffs into the lungs 2 (two) times daily.   Yes [provider]  CALCIUM-MAGNESIUM PO Take 1 tablet by mouth daily.   Yes  [provider]  cetirizine (ZYRTEC) 10 MG tablet Take 10 mg by mouth daily.   Yes [provider]  cholecalciferol (VITAMIN D) 1000 units tablet Take 1,000 Units by mouth daily.   Yes [provider]  diazepam (VALIUM) 5 MG tablet Take 5 mg by mouth every 6 (six) hours as needed for anxiety.   Yes [provider]  diphenhydrAMINE (BENADRYL) 25 mg capsule Take 25 mg by mouth every 6 (six) hours as needed for allergies.   Yes [provider]  EPINEPHrine (EPIPEN 2-PAK) 0.3 mg/0.3 mL IJ SOAJ injection Inject 0.3 mg into the muscle as needed (anaphylaxis).   Yes [provider]  FOLIC ACID PO Take 1 tablet by mouth daily.   Yes [provider]  furosemide (LASIX) 20 MG tablet Take 20 mg by mouth daily.   Yes [provider]  ipratropium-albuterol (DUONEB) 0.5-2.5 (3) MG/3ML SOLN Take 3 mLs by nebulization every 6 (six) hours as needed (shortness of breath).   Yes [provider]  levothyroxine (SYNTHROID, LEVOTHROID) 50 MCG tablet Take 50 mcg by mouth daily before breakfast.   Yes [provider]  montelukast (SINGULAIR) 10 MG tablet Take 10 mg by mouth at bedtime.   Yes [provider]  Multiple Vitamins-Minerals (MULTIVITAMIN WITH MINERALS) tablet Take 1 tablet by mouth  daily.   Yes [provider]  niacin 500 MG tablet Take 500 mg by mouth at bedtime.   Yes [provider]  OMEGA-3 FATTY ACIDS PO Take 1 capsule by mouth daily.   Yes [provider]  potassium chloride SA (K-DUR,KLOR-CON) 20 MEQ tablet Take 20 mEq by mouth daily.   Yes [provider]  predniSONE (DELTASONE) 5 MG tablet Take 5 mg by mouth See admin instructions. As needed for respiratory problems   Yes [provider]  theophylline (UNIPHYL) 400 MG 24 hr tablet Take 400 mg by mouth daily. May take another 200mg  if needed   Yes [provider]  vitamin B-12 (CYANOCOBALAMIN) 1000 MCG  tablet Take 1,000 mcg by mouth daily.   Yes [provider]  vitamin E 400 UNIT capsule Take 400 Units by mouth daily.   Yes [provider]    Family History No family history on file.  Social History Social History   Tobacco Use  . Smoking status: Never Smoker  . Smokeless tobacco: Never Used  Substance Use Topics  . Alcohol use: No  . Drug use: No     Allergies   Molds & smuts; Other; and Pollen extract   Review of Systems Review of Systems  Constitutional:       Per HPI, otherwise negative  HENT:       Per HPI, otherwise negative  Respiratory:       Per HPI, otherwise negative  Cardiovascular:       Per HPI, otherwise negative  Gastrointestinal: Negative for vomiting.  Endocrine:       Negative aside from HPI  Genitourinary:       Neg aside from HPI   Musculoskeletal:       Per HPI, otherwise negative  Skin: Negative.   Neurological: Negative for syncope.     Physical Exam Updated Vital Signs BP 123/64   Pulse 66   Temp 98 F (36.7 C) (Oral)   Resp 14   Ht 5\' 2"  (1.575 m)   Wt 95.3 kg (210 lb)   SpO2 95%   BMI 38.41 kg/m   Physical Exam  Constitutional: She is oriented to person, place, and time. She appears well-developed and well-nourished. No distress.  HENT:  Head: Normocephalic and atraumatic.  Eyes: Conjunctivae and EOM are normal.  Cardiovascular: Normal rate and regular rhythm.  Pulmonary/Chest: Effort normal and breath sounds normal. No stridor. No respiratory distress.  Abdominal: She exhibits no distension.  Musculoskeletal: She exhibits no edema.  Neurological: She is alert and oriented to person, place, and time. No cranial nerve deficit.  Skin: Skin is warm and dry.  Psychiatric: She has a normal mood and affect.  Nursing note and vitals reviewed.    ED Treatments / Results  Labs (all labs ordered are listed, but only abnormal results are displayed) Labs Reviewed  COMPREHENSIVE METABOLIC PANEL -  Abnormal; Notable for the following components:      Result Value   Glucose, Bld 152 (*)    Total Protein 6.0 (*)    All other components within normal limits  CBC WITH DIFFERENTIAL/PLATELET - Abnormal; Notable for the following components:   Hemoglobin 15.1 (*)    MCV 104.4 (*)    MCH 34.6 (*)    All other components within normal limits  TROPONIN I  BRAIN NATRIURETIC PEPTIDE  THEOPHYLLINE LEVEL    Radiology Dg Chest 2 View  Result Date: 07/27/2017 CLINICAL DATA:  Weakness and dizziness  for 2 days. EXAM: CHEST - 2 VIEW COMPARISON:  CT chest 07/08/2017. FINDINGS: The lungs are clear. There is cardiomegaly. Aortic atherosclerosis is noted. No pneumothorax or pleural effusion. No acute focal bony abnormality. IMPRESSION: No acute disease. Cardiomegaly. Atherosclerosis. Electronically Signed   By: Drusilla Kanner M.D.   On: 07/27/2017 16:06    Procedures Procedures (including critical care time)  Medications Ordered in ED Medications - No data to display   Initial Impression / Assessment and Plan / ED Course  I have reviewed the triage vital signs and the nursing notes.  Pertinent labs & imaging results that were available during my care of the patient were reviewed by me and considered in my medical decision making (see chart for details).     7:56 PM Patient states that she feels better, has no new complaints. She is now coming by her husband and we discussed all findings. Reassuring labs, x-ray, no evidence for heart failure, some suspicion for progression of her pulmonary disease Patient's theophylline level is not available, will not be until tomorrow. Given her improvement, patient would like to go home, and absent acute findings, evidence for pneumonia, CHF exacerbation, this is reasonable. She was encouraged to follow-up with pulmonology and her primary care in the coming days, given today's episode of dizziness and weakness.   Final Clinical Impressions(s) / ED  Diagnoses  Dizziness Weakness   Gerhard Munch, MD 07/27/17 1958

## 2017-07-27 NOTE — ED Triage Notes (Signed)
Pt brought in by [facility] from home for weakness and dizziness on standing x2 days. Pt has hx of COPD, does not use home O2 but wears a CPAP at night. Pt states she feels better when laying down. Per EMS pt did not have orthostatic changes. Pt A+Ox4 and in NAD on arrival.

## 2017-07-29 LAB — THEOPHYLLINE LEVEL: Theophylline Lvl: 7.7 ug/mL — ABNORMAL LOW (ref 10.0–20.0)

## 2017-08-02 DIAGNOSIS — E119 Type 2 diabetes mellitus without complications: Secondary | ICD-10-CM | POA: Insufficient documentation

## 2017-08-08 ENCOUNTER — Ambulatory Visit (INDEPENDENT_AMBULATORY_CARE_PROVIDER_SITE_OTHER): Payer: Medicare Other | Admitting: Internal Medicine

## 2017-08-08 ENCOUNTER — Telehealth: Payer: Self-pay | Admitting: Internal Medicine

## 2017-08-08 ENCOUNTER — Encounter: Payer: Self-pay | Admitting: Internal Medicine

## 2017-08-08 VITALS — BP 140/68 | HR 86 | Ht 62.0 in | Wt 205.8 lb

## 2017-08-08 DIAGNOSIS — J4540 Moderate persistent asthma, uncomplicated: Secondary | ICD-10-CM

## 2017-08-08 DIAGNOSIS — R0609 Other forms of dyspnea: Secondary | ICD-10-CM

## 2017-08-08 DIAGNOSIS — G4733 Obstructive sleep apnea (adult) (pediatric): Secondary | ICD-10-CM

## 2017-08-08 LAB — PULMONARY FUNCTION TEST
DL/VA % pred: 99 %
DL/VA: 4.44 ml/min/mmHg/L
DLCO UNC % PRED: 72 %
DLCO UNC: 15.07 ml/min/mmHg
DLCO cor % pred: 68 %
DLCO cor: 14.37 ml/min/mmHg
FEF 25-75 PRE: 0.74 L/s
FEF 25-75 Post: 2.04 L/sec
FEF2575-%Change-Post: 174 %
FEF2575-%Pred-Post: 130 %
FEF2575-%Pred-Pre: 47 %
FEV1-%CHANGE-POST: 32 %
FEV1-%PRED-PRE: 54 %
FEV1-%Pred-Post: 73 %
FEV1-PRE: 1.05 L
FEV1-Post: 1.39 L
FEV1FVC-%CHANGE-POST: 1 %
FEV1FVC-%Pred-Pre: 99 %
FEV6-%CHANGE-POST: 31 %
FEV6-%PRED-POST: 76 %
FEV6-%PRED-PRE: 58 %
FEV6-PRE: 1.41 L
FEV6-Post: 1.85 L
FEV6FVC-%CHANGE-POST: 0 %
FEV6FVC-%PRED-POST: 105 %
FEV6FVC-%PRED-PRE: 105 %
FVC-%Change-Post: 30 %
FVC-%Pred-Post: 72 %
FVC-%Pred-Pre: 55 %
FVC-Post: 1.85 L
FVC-Pre: 1.41 L
POST FEV1/FVC RATIO: 75 %
POST FEV6/FVC RATIO: 100 %
PRE FEV6/FVC RATIO: 100 %
Pre FEV1/FVC ratio: 74 %
RV % pred: 113 %
RV: 2.44 L
TLC % pred: 86 %
TLC: 4.05 L

## 2017-08-08 MED ORDER — TIOTROPIUM BROMIDE MONOHYDRATE 1.25 MCG/ACT IN AERS: 2.0000 | INHALATION_SPRAY | Freq: Every day | RESPIRATORY_TRACT | 0 refills | Status: DC

## 2017-08-08 NOTE — Telephone Encounter (Signed)
Diana  Imagene GurneySusan Schroll says she did NOT sleep for her sleep study. She is wondering what next  Thanks  Dr. Kalman ShanMurali Jaskaran Dauzat, M.D., [facility].C.P Pulmonary and Critical Care Medicine Staff Physician, [facility] Center Director - Interstitial Lung Disease  Program  Pulmonary Fibrosis [facility] at [facility] [place], [place], [id number]  Pager: [phone], If no answer or between  15:00h - 7:00h: call [phone] Telephone: [phone]

## 2017-08-08 NOTE — Patient Instructions (Addendum)
Moderate persistent asthma without complication Dyspnea on exertion  - tests are c/w asthma with possibility for improvement  - continue symbicort, theophylline, singulair scheduled - start spirivan respimat daily - take 2 samples and if works well we can send in script - use albuterol as needed  - flu shot in fall - Please talk to PCP Eartha InchBadger, Michael C, MD -  and ensure you get  shingarix vaccine   Obstructive sleep apnea - sent a message to Dr Maple HudsonYoung to addres your sleep study   Followup 6-9 months or sooner if needed

## 2017-08-08 NOTE — Progress Notes (Signed)
Subjective:     Patient ID: Diana [name], female   DOB: July 02, 1945, 72 y.o.   MRN: [id number]  HPI PCP Eartha Inch, MD  HPI   IOV  06/27/2017  Chief Complaint  Patient presents with  . Consult    Referred by Dr. Cyndia Bent for asthma.  Pt states she has been diagnosed with asthma in 2005 and also has allergies and was diagnosed with 1984. Pt denies any current complaints of cough, SOB, or CP. States she recently moved from [place] and needs to be established with a new Pulmonologist.   Diana [name] , 72 y.o. , with dob [birth year] and female ,Unavailable from [address] [place] [address] - presents to pulm clinic for establishing care for asthma after moving here from [place], 72 year old female here with her husband.  She is here for evaluation of noisy breathing, shortness of breath with exertion and wheezing with a background history of asthma and allergy dating back decades.  History is given by her and by her husband and to a limited extent by review of the old chart.    And as best as I can gather she used o live in [place] up until 2012 but back in the 1970s and 1980s she used to also live in [place] on the [place] and then shifted back and forth between [place] to [place].  In 1984 she was diagnosed to have significant allergies to different agents.  She then set up with her allergist and was on multiple allergy shots but she used to carry with her given her trip to [place].  While in [place] she would visit with Dr. Felipe Drone and get shots. IIn 2006 she was referred to a pulmonologist who diagnosed her with sleep apnea and started on CPAP which was then changed to BiPAP in 2010.    In 2012 she moved from [place] to [place] and he has not followed with an allergist until recently in April 2019 when she registered with Dr. Milton Callas in the interim in 2014 she did stop her allergy shots.  Most recently when she saw Dr. Owsley Callas allergy  test was essentially negative except 2+ for cat and since then she has been on cat allergy shots.  Her medication profile indicates she has been on theophylline for many decades which she is unwilling to quit.  She is been on Symbicort for many years and more recently since April 2019 on Singulair, cat allergy shot, Flonase and prednisone as needed.  According to her husband the main issue is her dyspnea on exertion when she climbs a flight of stairs noisy breathing and wheezing.  These are multi-decade problems with her husband feels like over time gradually they are getting better but still unresolved.  They do need to establish with a primary pulmonologist in the area and also sleep doctor and that 1 of the main purposes of the visit today.  She also has nonspecific inability to stand for a long time because her legs give out.  But she is okay walking apparently.  In terms of her asthma control questionnaire symptoms she does not wake up in the middle of the night with symptoms, when she wakes up in the morning she does not have any symptoms, her activities only slightly limited because of asthma.  She experience a little shortness of breath,  and she wheezes a moderate amount of the time and does not use albuterol for rescue.   Feno -  unable   Cards past hx per DR Selena BattenKim records [facility] Pt reports cardiac catheterization 2010 with mild coronary disease Nuclear stress test 12/2015 negative for ischemia, normal post stress EF Echo 11/2015 showed EF 60-65% with normal wall motion, 1-2+ MR, moderate pulmonary hypertension, stage 1 DD    OV 08/08/2017  Chief Complaint  Patient presents with  . Follow-up    PFT today, pt states her breathing is doing ok     Follow-up dyspnea with associated obesity and a history of asthma on Symbicort, theophylline and Singulair  In the interim she had CT scan of the chest that did not show any lung cancer interstitial lung disease. She probably function test today  and is here for follow-up. This shows obstruction at FEV1 50% with significant bronchodilator response taking it up to 70% or so. I personally visualized image. She also has some air trapping. At this point in time she feels stable without any complaints. Review of the chart indicates that in the interim she did have a sleep study which she says she did not sleep properly. I passes message on to Dr Maple HudsonYoung. Also 07/27/2017 she ended up in the ER for dizziness Workup was negative.H er theophylline level was some 0.7and not toxic. She definitely does not want to stop the theophylline   Results for Diana GurneySWANSON, Diana [name] (MRN [id number]) as of 08/08/2017 11:35  Ref. Range 07/27/2017 15:43  B Natriuretic Peptide Latest Ref Range: 0.0 - 100.0 pg/mL 90.8  Troponin I Latest Ref Range: <0.03 ng/mL <0.03   Results for Diana GurneySWANSON, Diana [name] (MRN [id number]) as of 08/08/2017 11:35  Ref. Range 07/27/2017 15:43  Eosinophils Absolute Latest Ref Range: 0.0 - 0.7 K/uL 0.1   Results for Diana GurneySWANSON, Diana [name] (MRN [id number]) as of 08/08/2017 11:35  Ref. Range 08/08/2017 09:42  FEV1-Post Latest Units: L 1.39  FEV1-%Pred-Post Latest Units: % 73  FEV1-%Change-Post Latest Units: % 32  Post FEV1/FVC ratio Latest Units: % 75   Results for Diana GurneySWANSON, Diana [name] (MRN [id number]) as of 08/08/2017 11:35  Ref. Range 08/08/2017 09:42  RV Latest Units: L 2.44  RV % pred Latest Units: % 113  Results for Diana GurneySWANSON, Diana [name] (MRN [id number]) as of 08/08/2017 11:35  Ref. Range 08/08/2017 09:42  DLCO cor Latest Units: ml/min/mmHg 14.37  DLCO cor % pred Latest Units: % 68    has a past medical history of Allergic rhinitis, Asthma, COPD (chronic obstructive pulmonary disease) (HCC), Gout, Left bundle branch block, Sleep apnea, and Umbilical hernia.   reports that she has never smoked. She has never used smokeless tobacco.  Past Surgical History:  Procedure Laterality Date  . ADENOIDECTOMY    . BREAST LUMPECTOMY Right   . CARDIAC CATHETERIZATION    . CATARACT  EXTRACTION, BILATERAL    . ROOT CANAL    . SKIN BIOPSY    . TONSILLECTOMY    . WISDOM TOOTH EXTRACTION      Allergies  Allergen Reactions  . Molds & Smuts Shortness Of Breath  . Other Shortness Of Breath    CAT  . Pollen Extract Shortness Of Breath    Immunization History  Administered Date(s) Administered  . Influenza, High Dose Seasonal PF 12/08/2015, 11/04/2016  . Influenza, Seasonal, Injecte, Preservative Fre 11/16/2008, 09/12/2010, 10/22/2011, 12/25/2012, 11/22/2014  . Pneumococcal Conjugate-13 12/25/2012  . Pneumococcal Polysaccharide-23 11/22/2014  . Tdap 11/16/2008, 11/22/2014  . Zoster 09/19/2008    No family history on file.   Current Outpatient Medications:  .  albuterol (PROVENTIL HFA;VENTOLIN HFA) 108 (90 Base) MCG/ACT  inhaler, Inhale 2 puffs into the lungs every 6 (six) hours as needed for wheezing or shortness of breath., Disp: , Rfl:  .  allopurinol (ZYLOPRIM) 300 MG tablet, Take 300 mg by mouth daily., Disp: , Rfl:  .  aspirin EC 81 MG tablet, Take 81 mg by mouth daily., Disp: , Rfl:  .  budesonide-formoterol (SYMBICORT) 160-4.5 MCG/ACT inhaler, Inhale 2 puffs into the lungs 2 (two) times daily., Disp: , Rfl:  .  CALCIUM-MAGNESIUM PO, Take 1 tablet by mouth daily., Disp: , Rfl:  .  cetirizine (ZYRTEC) 10 MG tablet, Take 10 mg by mouth daily., Disp: , Rfl:  .  cholecalciferol (VITAMIN D) 1000 units tablet, Take 1,000 Units by mouth daily., Disp: , Rfl:  .  diazepam (VALIUM) 5 MG tablet, Take 5 mg by mouth every 6 (six) hours as needed for anxiety., Disp: , Rfl:  .  diphenhydrAMINE (BENADRYL) 25 mg capsule, Take 25 mg by mouth every 6 (six) hours as needed for allergies., Disp: , Rfl:  .  EPINEPHrine (EPIPEN 2-PAK) 0.3 mg/0.3 mL IJ SOAJ injection, Inject 0.3 mg into the muscle as needed (anaphylaxis)., Disp: , Rfl:  .  FOLIC ACID PO, Take 1 tablet by mouth daily., Disp: , Rfl:  .  furosemide (LASIX) 20 MG tablet, Take 20 mg by mouth daily., Disp: , Rfl:  .   ipratropium-albuterol (DUONEB) 0.5-2.5 (3) MG/3ML SOLN, Take 3 mLs by nebulization every 6 (six) hours as needed (shortness of breath)., Disp: , Rfl:  .  levothyroxine (SYNTHROID, LEVOTHROID) 50 MCG tablet, Take 50 mcg by mouth daily before breakfast., Disp: , Rfl:  .  metFORMIN (GLUCOPHAGE) 500 MG tablet, Take 500 mg by mouth 2 (two) times daily with a meal., Disp: , Rfl:  .  montelukast (SINGULAIR) 10 MG tablet, Take 10 mg by mouth at bedtime., Disp: , Rfl:  .  Multiple Vitamins-Minerals (MULTIVITAMIN WITH MINERALS) tablet, Take 1 tablet by mouth daily., Disp: , Rfl:  .  niacin 500 MG tablet, Take 500 mg by mouth at bedtime., Disp: , Rfl:  .  OMEGA-3 FATTY ACIDS PO, Take 1 capsule by mouth daily., Disp: , Rfl:  .  potassium chloride SA (K-DUR,KLOR-CON) 20 MEQ tablet, Take 20 mEq by mouth daily., Disp: , Rfl:  .  predniSONE (DELTASONE) 5 MG tablet, Take 5 mg by mouth See admin instructions. As needed for respiratory problems, Disp: , Rfl:  .  theophylline (UNIPHYL) 400 MG 24 hr tablet, Take 400 mg by mouth daily. May take another 200mg  if needed, Disp: , Rfl:  .  vitamin B-12 (CYANOCOBALAMIN) 1000 MCG tablet, Take 1,000 mcg by mouth daily., Disp: , Rfl:  .  vitamin E 400 UNIT capsule, Take 400 Units by mouth daily., Disp: , Rfl:    Review of Systems     Objective:   Physical Exam  Constitutional: She is oriented to person, place, and time. She appears well-developed and well-nourished. No distress.  HENT:  Head: Normocephalic and atraumatic.  Right Ear: External ear normal.  Left Ear: External ear normal.  Mouth/Throat: Oropharynx is clear and moist. No oropharyngeal exudate.  Eyes: Pupils are equal, round, and reactive to light. Conjunctivae and EOM are normal. Right eye exhibits no discharge. Left eye exhibits no discharge. No scleral icterus.  Neck: Normal range of motion. Neck supple. No JVD present. No tracheal deviation present. No thyromegaly present.  Cardiovascular: Normal rate,  regular rhythm, normal heart sounds and intact distal pulses. Exam reveals no gallop and no friction  rub.  No murmur heard. Pulmonary/Chest: Effort normal and breath sounds normal. No respiratory distress. She has no wheezes. She has no rales. She exhibits no tenderness.  Abdominal: Soft. Bowel sounds are normal. She exhibits no distension and no mass. There is no tenderness. There is no rebound and no guarding.  Musculoskeletal: Normal range of motion. She exhibits no edema or tenderness.  Lymphadenopathy:    She has no cervical adenopathy.  Neurological: She is alert and oriented to person, place, and time. She has normal reflexes. No cranial nerve deficit. She exhibits normal muscle tone. Coordination normal.  Skin: Skin is warm and dry. No rash noted. She is not diaphoretic. No erythema. No pallor.  Psychiatric: She has a normal mood and affect. Her behavior is normal. Judgment and thought content normal.  Vitals reviewed.  Vitals:   08/08/17 1045  BP: 140/68  Pulse: 86  SpO2: 94%  Weight: 205 lb 12.8 oz (93.4 kg)  Height: 5\' 2"  (1.575 m)    Estimated body mass index is 37.64 kg/m as calculated from the following:   Height as of this encounter: 5\' 2"  (1.575 m).   Weight as of this encounter: 205 lb 12.8 oz (93.4 kg).     Assessment:       ICD-10-CM   1. Moderate persistent asthma without complication J45.40   2. Dyspnea on exertion R06.09   3. Obstructive sleep apnea G47.33        Plan:     Moderate persistent asthma without complication Dyspnea on exertion  - tests are c/w asthma with possibility for improvement  - continue symbicort, theophylline, singulair scheduled - start spirivan respimat daily - take 2 samples and if works well we can send in script - use albuterol as needed  - flu shot in fall - Please talk to PCP Eartha Inch, MD -  and ensure you get  shingarix vaccine   Obstructive sleep apnea - sent a message to Dr Maple Hudson to addres your sleep  study   Followup 6-9 months or sooner if needed  Dr. Kalman Shan, M.D., [facility].C.P Pulmonary and Critical Care Medicine Staff Physician, [facility] Center Director - Interstitial Lung Disease  Program  Pulmonary Fibrosis [facility] Network at [facility] [place], [place], [address]  Pager: [phone], If no answer or between  15:00h - 7:00h: call [phone] Telephone: [phone]

## 2017-08-08 NOTE — Progress Notes (Signed)
PFT done today. 

## 2017-08-18 ENCOUNTER — Other Ambulatory Visit: Payer: Self-pay | Admitting: Internal Medicine

## 2017-08-18 DIAGNOSIS — G4733 Obstructive sleep apnea (adult) (pediatric): Secondary | ICD-10-CM

## 2017-08-20 ENCOUNTER — Telehealth: Payer: Self-pay | Admitting: Internal Medicine

## 2017-08-20 MED ORDER — TIOTROPIUM BROMIDE MONOHYDRATE 1.25 MCG/ACT IN AERS: 2.0000 | INHALATION_SPRAY | Freq: Every day | RESPIRATORY_TRACT | 1 refills | Status: DC

## 2017-08-20 NOTE — Telephone Encounter (Signed)
Spoke with pt. She is needing a prescription for Spiriva Respimat to be sent to her pharmacy. Rx has been sent in. Nothing further was needed.

## 2017-08-29 ENCOUNTER — Other Ambulatory Visit: Payer: Self-pay | Admitting: Internal Medicine

## 2017-09-02 ENCOUNTER — Telehealth: Payer: Self-pay | Admitting: Internal Medicine

## 2017-09-02 NOTE — Telephone Encounter (Signed)
atc pt X2, line rang to fast busy signal.  wcb. 

## 2017-09-03 MED ORDER — THEOPHYLLINE ER 400 MG PO TB24: 400.0000 mg | ORAL_TABLET | Freq: Every day | ORAL | 3 refills | Status: DC

## 2017-09-03 MED ORDER — IPRATROPIUM-ALBUTEROL 0.5-2.5 (3) MG/3ML IN SOLN: 3.0000 mL | Freq: Four times a day (QID) | RESPIRATORY_TRACT | 3 refills | Status: AC | PRN

## 2017-09-03 MED ORDER — ALBUTEROL SULFATE HFA 108 (90 BASE) MCG/ACT IN AERS: 2.0000 | INHALATION_SPRAY | Freq: Four times a day (QID) | RESPIRATORY_TRACT | 3 refills | Status: DC | PRN

## 2017-09-03 MED ORDER — BUDESONIDE-FORMOTEROL FUMARATE 160-4.5 MCG/ACT IN AERO: 2.0000 | INHALATION_SPRAY | Freq: Two times a day (BID) | RESPIRATORY_TRACT | 3 refills | Status: DC

## 2017-09-03 MED ORDER — MONTELUKAST SODIUM 10 MG PO TABS: 10.0000 mg | ORAL_TABLET | Freq: Every day | ORAL | 3 refills | Status: AC

## 2017-09-03 NOTE — Telephone Encounter (Signed)
Called and spoke with patient in regards of needs rx refills Pt needs refill for thephylline 400mg , Albuterol Inhaler, Spirva Respimat 1.25 and Singulair  All scripts have been sent to pharmacy [facility] today Nothing further needed.

## 2017-10-02 ENCOUNTER — Ambulatory Visit: Payer: Medicare Other | Admitting: Internal Medicine

## 2017-10-21 ENCOUNTER — Encounter: Payer: Self-pay | Admitting: Internal Medicine

## 2017-10-22 ENCOUNTER — Ambulatory Visit (INDEPENDENT_AMBULATORY_CARE_PROVIDER_SITE_OTHER): Payer: Medicare Other | Admitting: Internal Medicine

## 2017-10-22 ENCOUNTER — Encounter: Payer: Self-pay | Admitting: Internal Medicine

## 2017-10-22 DIAGNOSIS — G4733 Obstructive sleep apnea (adult) (pediatric): Secondary | ICD-10-CM | POA: Diagnosis not present

## 2017-10-22 DIAGNOSIS — J449 Chronic obstructive pulmonary disease, unspecified: Secondary | ICD-10-CM

## 2017-10-22 NOTE — Progress Notes (Signed)
HPI-   female never smoker followed for OSA complicated by asthma/COPD (Dr. Marchelle Gearing), allergic rhinitis (Dr. [place] Callas), gout NPSG 07/21/17-AHI 15.2/hour, desaturation to 84%, body weight 210 pounds PFT 08/08/2017-mild obstruction with significant response to bronchodilator, mild diffusion reduction.  FVC 1.85/72%, FEV1 1.39/73%, ratio 0.75, TLC 86%, DLCO 72%  -----------------------------------------------------------------------------------------  06/30/2017-72 year old female never smoker for sleep evaluation.  Medical problem list includes asthma( Dr Marchelle Gearing), allergic rhinitis( Dr [place] Callas). ----Sleep consult: Pt needs to establish with sleep MD and DME locally-moved here from [place]. DL attached.  Regional sleep study was in 2006 with subsequent study in 2011 and current machine was new in 2011.  She is very comfortable with BiPAP I 10,  E6, Biflex 3.  Getting supplies from [facility] and using a nasal mask and humidifier. Download 100% compliance AHI 3.1/hour. ENT surgery-tonsils.  10/22/2017-72 year old female never smoker followed for OSA complicated by asthma/COPD (Dr. Marchelle Gearing), allergic rhinitis (Dr. [place] Callas), gout NPSG 07/21/17-AHI 15.2/hour, desaturation to 84%, body weight 210 pounds OSA: DME: [facility] Pt wears CPAP nighlty with auto 5-20 settings; DL attached.  CPAP auto 5-20/[facility] PFT 08/08/2017-mild obstruction with significant response to bronchodilator, mild diffusion reduction.  FVC 1.85/72%, FEV1 1.39/73%, ratio 0.75, TLC 86%, DLCO 72% Body weight today 203 pounds She has been on CPAP/BiPAP since 2006.  After update sleep study in July she is using CPAP with AutoPap and is quite comfortable with this.  Nasal mask.  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 Download 100% compliance AHI 2.8/hour. Followed by Dr. Marchelle Gearing for her asthma/COPD-feels clear today.  Note PFT in July.  ROS-see HPI   + = positive Constitutional:    weight loss, night  sweats, fevers, chills, fatigue, lassitude. HEENT:    headaches, difficulty swallowing, tooth/dental problems, sore throat,       sneezing, itching, ear ache, nasal congestion, post nasal drip, snoring CV:    chest pain, orthopnea, PND, swelling in lower extremities, anasarca,                                                      dizziness, palpitations Resp:   shortness of breath with exertion or at rest.                productive cough,   non-productive cough, coughing up of blood.              change in color of mucus.  wheezing.   Skin:    rash or lesions. GI:  No-   heartburn, indigestion, abdominal pain, nausea, vomiting, diarrhea,                 change in bowel habits, loss of appetite GU: dysuria, change in color of urine, no urgency or frequency.   flank pain. MS:   joint pain, stiffness, decreased range of motion, back pain. Neuro-     nothing unusual Psych:  change in mood or affect.  depression or anxiety.   memory loss.  OBJ- Physical Exam General- Alert, Oriented, Affect-appropriate, Distress- none acute, + obese Skin- rash-none, lesions- none, excoriation- none Lymphadenopathy- none Head- atraumatic            Eyes- Gross vision intact, PERRLA, conjunctivae and secretions clear            Ears-+ bilateral hearing aids  Nose- Clear, no-Septal dev, mucus, polyps, erosion, perforation             Throat- Mallampati IV , mucosa clear , drainage- none, tonsils- atrophic Neck- flexible , trachea midline, no stridor , thyroid nl, carotid no bruit Chest - symmetrical excursion , unlabored           Heart/CV- RRR , no murmur , no gallop  , no rub, nl s1 s2                           - JVD- none , edema- none, stasis changes- none, varices- none           Lung- clear to P&A, wheeze- none, cough- none , dullness-none, rub- none           Chest wall-  Abd-  Br/ Gen/ Rectal- Not done, not indicated Extrem- cyanosis- none, clubbing, none, atrophy- none, strength- nl Neuro-  grossly intact to observation

## 2017-10-22 NOTE — Assessment & Plan Note (Signed)
She continues to benefit and is now very happy with CPAP auto 5-20.  Sleeping better. Plan-continue CPAP auto 5-20

## 2017-10-22 NOTE — Patient Instructions (Signed)
We can continue CPAP auto 5-20, mask of choice, humidifier, supplies, Airview  Please call if we can help 

## 2017-10-22 NOTE — Assessment & Plan Note (Signed)
She feels well today.  PFT showed significant response to bronchodilator emphasizing reversible opponent. Plan-she will continue to follow this problem with Dr. Marchelle Gearing

## 2017-12-02 ENCOUNTER — Ambulatory Visit (INDEPENDENT_AMBULATORY_CARE_PROVIDER_SITE_OTHER): Payer: Medicare Other | Admitting: Podiatry

## 2017-12-02 ENCOUNTER — Encounter: Payer: Self-pay | Admitting: Podiatry

## 2017-12-02 DIAGNOSIS — L84 Corns and callosities: Secondary | ICD-10-CM | POA: Diagnosis not present

## 2017-12-02 NOTE — Progress Notes (Signed)
This patient  presents to the office with chief complaint of a painful corn that has developed between her second and third toes, left foot.   She says that her second and third toes are in close proximity and have developed a painful corn.  She has provided no self treatment nor sought any professional help.  She also has an enlarged callus noted on the outside of her left forefoot.  She presents the office today for an evaluation and treatment of her painful corn.  Vascular  Dorsalis pedis and posterior tibial pulses are palpable  B/L.  Capillary return  WNL.  Temperature gradient is  WNL.  Skin turgor  WNL  Sensorium  Senn Weinstein monofilament wire  WNL. Normal tactile sensation.  Nail Exam  Patient has normal nails with no evidence of bacterial or fungal infection.  Orthopedic  Exam  Muscle tone and muscle strength  WNL.  No limitations of motion feet  B/L.  No crepitus or joint effusion noted.  Foot type is unremarkable and digits show no abnormalities.  Bony prominence of fifth metatarsal left foot.    Skin  No open lesions.  Normal skin texture and turgor. Corn 2/3 left foot.  Callus lateral aspect fifth metatarsal left foot.  Callus  Left foot  Debride callus  Left foot.  RTC 3 months.   Helane GuntherGregory Therese Rocco DPM

## 2018-01-12 ENCOUNTER — Other Ambulatory Visit: Payer: Self-pay | Admitting: Internal Medicine

## 2018-02-09 ENCOUNTER — Other Ambulatory Visit: Payer: Self-pay | Admitting: Internal Medicine

## 2018-03-04 ENCOUNTER — Ambulatory Visit: Payer: Federal, State, Local not specified - PPO | Admitting: Podiatry

## 2018-03-05 ENCOUNTER — Telehealth: Payer: Self-pay | Admitting: Internal Medicine

## 2018-03-05 MED ORDER — THEOPHYLLINE ER 400 MG PO TB24: 400.0000 mg | ORAL_TABLET | Freq: Every day | ORAL | 0 refills | Status: DC

## 2018-03-05 NOTE — Telephone Encounter (Signed)
Rx has been sent in. Nothing further was needed. 

## 2018-04-18 ENCOUNTER — Other Ambulatory Visit: Payer: Self-pay | Admitting: Internal Medicine

## 2018-05-13 ENCOUNTER — Ambulatory Visit: Payer: Federal, State, Local not specified - PPO | Admitting: Podiatry

## 2018-06-05 ENCOUNTER — Other Ambulatory Visit: Payer: Self-pay | Admitting: Internal Medicine

## 2018-07-10 ENCOUNTER — Telehealth: Payer: Self-pay | Admitting: Interventional Cardiology

## 2018-07-10 NOTE — Telephone Encounter (Signed)
New Message:    Diana [name] would like to know if [name] would be her Cardiologist?. Her husband, Diana [name] is already [name]  patient.

## 2018-07-14 NOTE — Telephone Encounter (Signed)
Follow Up:   Pt is calling to see if [name] will see her as anew pt please?

## 2018-07-16 NOTE — Telephone Encounter (Signed)
Spoke with patient's husband (patient of Dr. Irish Lack). He states that the patient's former cardiologist at [place], [place] Joellyn Rued, MD has moved and the patient was wanting to establish care with a new cardiologist and wanted to be with Dr. Irish Lack since he is. Patient has a history of LBBB, HTN, HLD, and CAD. Notes are in [place]. Husband states that the patient does not have any Sx or problems at this time. Appt made for 9/1.

## 2018-08-12 ENCOUNTER — Ambulatory Visit (INDEPENDENT_AMBULATORY_CARE_PROVIDER_SITE_OTHER): Payer: Medicare Other | Admitting: Podiatry

## 2018-08-12 ENCOUNTER — Encounter: Payer: Self-pay | Admitting: Podiatry

## 2018-08-12 ENCOUNTER — Other Ambulatory Visit: Payer: Self-pay

## 2018-08-12 VITALS — Temp 97.9°F

## 2018-08-12 DIAGNOSIS — L84 Corns and callosities: Secondary | ICD-10-CM

## 2018-08-12 NOTE — Progress Notes (Signed)
This patient  presents to the office with chief complaint of a painful corn that has developed between her second and third toes, left foot.   .  She also has an enlarged callus noted on the outside of her left forefoot.  She presents the office today for an evaluation and treatment of her painful calluses.  Vascular  Dorsalis pedis and posterior tibial pulses are palpable  B/L.  Capillary return  WNL.  Temperature gradient is  WNL.  Skin turgor  WNL  Sensorium  Senn Weinstein monofilament wire  WNL. Normal tactile sensation.  Nail Exam  Patient has normal nails with no evidence of bacterial or fungal infection.  Orthopedic  Exam  Muscle tone and muscle strength  WNL.  No limitations of motion feet  B/L.  No crepitus or joint effusion noted.  Foot type is unremarkable and digits show no abnormalities.  Bony prominence of fifth metatarsal left foot.    Skin  No open lesions.  Normal skin texture and turgor. Corn 2/3 left foot.  Callus lateral aspect fifth metatarsal left foot.  Callus  Left foot  Debride callus  Left foot.  RTC 3 months.  Patient says she has back pain and desires insoles to be worn in her shoes.   Gardiner Barefoot DPM

## 2018-09-06 ENCOUNTER — Other Ambulatory Visit: Payer: Self-pay | Admitting: Internal Medicine

## 2018-09-22 ENCOUNTER — Other Ambulatory Visit: Payer: Self-pay

## 2018-09-22 ENCOUNTER — Ambulatory Visit (INDEPENDENT_AMBULATORY_CARE_PROVIDER_SITE_OTHER): Payer: Medicare Other | Admitting: Interventional Cardiology

## 2018-09-22 ENCOUNTER — Encounter: Payer: Self-pay | Admitting: Interventional Cardiology

## 2018-09-22 VITALS — BP 120/70 | HR 97 | Ht 62.0 in | Wt 195.8 lb

## 2018-09-22 DIAGNOSIS — I447 Left bundle-branch block, unspecified: Secondary | ICD-10-CM

## 2018-09-22 DIAGNOSIS — I2510 Atherosclerotic heart disease of native coronary artery without angina pectoris: Secondary | ICD-10-CM

## 2018-09-22 DIAGNOSIS — E782 Mixed hyperlipidemia: Secondary | ICD-10-CM | POA: Diagnosis not present

## 2018-09-22 NOTE — Patient Instructions (Signed)

## 2018-09-22 NOTE — Progress Notes (Signed)
Cardiology Office Note   Date:  09/22/2018   ID:  Diana [name], DOB [birth year], MRN [id number]  PCP:  Chesley Noon, MD    No chief complaint on file.  CAD, LBBB  Wt Readings from Last 3 Encounters:  09/22/18 195 lb 12.8 oz (88.8 kg)  10/22/17 203 lb 3.2 oz (92.2 kg)  08/08/17 205 lb 12.8 oz (93.4 kg)       History of Present Illness: Diana [name] is a 73 y.o. female  Who had a LBBB and mild CAD in 2008.  She had a false positive cardiolyte at that time.   No cath since that time.    Wearing CPAP.  Not exercising.   No recent stress test.  Recently moved to [place] from [place].   Denies : Chest pain. Dizziness. Leg edema. Nitroglycerin use. Orthopnea. Palpitations. Paroxysmal nocturnal dyspnea. Shortness of breath. Syncope.   Walking less since COVID 19.  Eating healthier.    Past Medical History:  Diagnosis Date  . Allergic rhinitis   . Asthma   . COPD (chronic obstructive pulmonary disease) ([place])   . Gout   . Left bundle branch block   . Sleep apnea   . Umbilical hernia     Past Surgical History:  Procedure Laterality Date  . ADENOIDECTOMY    . BREAST LUMPECTOMY Right   . CARDIAC CATHETERIZATION    . CATARACT EXTRACTION, BILATERAL    . ROOT CANAL    . SKIN BIOPSY    . TONSILLECTOMY    . WISDOM TOOTH EXTRACTION       Current Outpatient Medications  Medication Sig Dispense Refill  . albuterol (PROVENTIL HFA;VENTOLIN HFA) 108 (90 Base) MCG/ACT inhaler Inhale 2 puffs into the lungs every 6 (six) hours as needed for wheezing or shortness of breath. 1 Inhaler 3  . allopurinol (ZYLOPRIM) 300 MG tablet Take 300 mg by mouth daily.    Marland Kitchen aspirin EC 81 MG tablet Take 81 mg by mouth daily.    Marland Kitchen CALCIUM-MAGNESIUM PO Take 1 tablet by mouth daily.    . cetirizine (ZYRTEC) 10 MG tablet Take 10 mg by mouth daily.    . cholecalciferol (VITAMIN D) 1000 units tablet Take 1,000 Units by mouth daily.    . diazepam (VALIUM) 5 MG tablet Take 5 mg by mouth every 6  (six) hours as needed for anxiety.    . diphenhydrAMINE (BENADRYL) 25 mg capsule Take 25 mg by mouth every 6 (six) hours as needed for allergies.    Marland Kitchen EPINEPHrine (EPIPEN 2-PAK) 0.3 mg/0.3 mL IJ SOAJ injection Inject 0.3 mg into the muscle as needed (anaphylaxis).    . fluticasone (FLONASE) 50 MCG/ACT nasal spray     . FOLIC ACID PO Take 1 tablet by mouth daily.    . furosemide (LASIX) 20 MG tablet Take 20 mg by mouth daily.    Marland Kitchen ipratropium-albuterol (DUONEB) 0.5-2.5 (3) MG/3ML SOLN Take 3 mLs by nebulization every 6 (six) hours as needed (shortness of breath). 360 mL 3  . levothyroxine (SYNTHROID) 50 MCG tablet TAKE 1 TABLET BY MOUTH DAILY    . metFORMIN (GLUCOPHAGE) 500 MG tablet Take 500 mg by mouth 2 (two) times daily with a meal.    . montelukast (SINGULAIR) 10 MG tablet Take 1 tablet (10 mg total) by mouth at bedtime. 30 tablet 3  . Multiple Vitamins-Minerals (MULTIVITAMIN WITH MINERALS) tablet Take 1 tablet by mouth daily.    . niacin (NIASPAN) 500 MG CR tablet  1  . niacin 500 MG tablet Take 500 mg by mouth at bedtime.    . OMEGA-3 FATTY ACIDS PO Take 1 capsule by mouth daily.    . potassium chloride SA (K-DUR,KLOR-CON) 20 MEQ tablet Take 20 mEq by mouth daily.    . simvastatin (ZOCOR) 20 MG tablet TK 1 T PO HS  1  . SPIRIVA RESPIMAT 1.25 MCG/ACT AERS INHALE 2 PUFFS INTO THE LUNGS DAILY 12 g 3  . SYMBICORT 160-4.5 MCG/ACT inhaler INHALE 2 PUFFS INTO THE LUNGS TWICE DAILY 10.2 g 5  . temazepam (RESTORIL) 30 MG capsule     . theophylline (UNIPHYL) 400 MG 24 hr tablet TAKE 1 TABLET BY MOUTH EVERY DAY. MAY TAKE ANOTHER 200MG  AS NEEDED 180 tablet 0  . UNABLE TO FIND Take by mouth.    . vitamin B-12 (CYANOCOBALAMIN) 1000 MCG tablet Take 1,000 mcg by mouth daily.    . vitamin E 400 UNIT capsule Take 400 Units by mouth daily.     No current facility-administered medications for this visit.     Allergies:   Molds & smuts, Other, and Pollen extract    Social History:  The patient   reports that she has never smoked. She has never used smokeless tobacco. She reports that she does not drink alcohol or use drugs.   Family History:  The patient'sfamily history includes Heart disease in her mother; Stroke in her brother.    ROS:  Please see the history of present illness.   Otherwise, review of systems are positive for intentional weight loss .   All other systems are reviewed and negative.    PHYSICAL EXAM: VS:  BP 120/70   Pulse 97   Ht 5\' 2"  (1.575 m)   Wt 195 lb 12.8 oz (88.8 kg)   SpO2 94%   BMI 35.81 kg/m  , BMI Body mass index is 35.81 kg/m. GEN: Well nourished, well developed, in no acute distress  HEENT: normal  Neck: no JVD, carotid bruits, or masses Cardiac: RRR; no murmurs, rubs, or gallops,no edema  Respiratory:  clear to auscultation bilaterally, normal work of breathing GI: soft, nontender, nondistended, + BS, obese MS: no deformity or atrophy  Skin: warm and dry, no rash Neuro:  Strength and sensation are intact Psych: euthymic mood, full affect   EKG:   The ekg ordered today demonstrates NSR, LBBB   Recent Labs: No results found for requested labs within last 8760 hours.   Lipid Panel No results found for: CHOL, TRIG, HDL, CHOLHDL, VLDL, LDLCALC, LDLDIRECT   Other studies Reviewed: Additional studies/ records that were reviewed today with results demonstrating: see below .   ASSESSMENT AND PLAN:  1. CAD: No angina. Mild CAD- 30% lesions.  Continue aggressive secondary prevention. 2008 cath results reviewed 2. LBBB: Known for over 10 years ago. 3. Hyperlipidemia: LDL 70 on simvastatin.  Checked in 12/19. 4. No cardiac sx at this time.  Continue preventive therapy.    Current medicines are reviewed at length with the patient today.  The patient concerns regarding her medicines were addressed.  The following changes have been made:  No change  Labs/ tests ordered today include:  No orders of the defined types were placed in this  encounter.   Recommend 150 minutes/week of aerobic exercise Low fat, low carb, high fiber diet recommended  Disposition:   FU in 1 year   Signed, Lance MussJayadeep Lauri Purdum, MD  09/22/2018 11:34 AM    [facility]  [address] [place], [place]  [address] Phone: [phone]; Fax: [phone]

## 2018-10-21 ENCOUNTER — Encounter: Payer: Self-pay | Admitting: Internal Medicine

## 2018-10-23 ENCOUNTER — Other Ambulatory Visit: Payer: Self-pay

## 2018-10-23 ENCOUNTER — Encounter: Payer: Self-pay | Admitting: Internal Medicine

## 2018-10-23 ENCOUNTER — Ambulatory Visit (INDEPENDENT_AMBULATORY_CARE_PROVIDER_SITE_OTHER): Payer: Medicare Other | Admitting: Internal Medicine

## 2018-10-23 DIAGNOSIS — J449 Chronic obstructive pulmonary disease, unspecified: Secondary | ICD-10-CM

## 2018-10-23 DIAGNOSIS — G4733 Obstructive sleep apnea (adult) (pediatric): Secondary | ICD-10-CM

## 2018-10-23 DIAGNOSIS — I2510 Atherosclerotic heart disease of native coronary artery without angina pectoris: Secondary | ICD-10-CM | POA: Diagnosis not present

## 2018-10-23 MED ORDER — SPIRIVA RESPIMAT 1.25 MCG/ACT IN AERS: 2.0000 | INHALATION_SPRAY | Freq: Every day | RESPIRATORY_TRACT | 4 refills | Status: DC

## 2018-10-23 MED ORDER — BUDESONIDE-FORMOTEROL FUMARATE 160-4.5 MCG/ACT IN AERO: 2.0000 | INHALATION_SPRAY | Freq: Two times a day (BID) | RESPIRATORY_TRACT | 4 refills | Status: DC

## 2018-10-23 MED ORDER — FLUTICASONE PROPIONATE 50 MCG/ACT NA SUSP: NASAL | 4 refills | Status: DC

## 2018-10-23 MED ORDER — THEOPHYLLINE ER 400 MG PO TB24: ORAL_TABLET | ORAL | 4 refills | Status: DC

## 2018-10-23 NOTE — Progress Notes (Signed)
HPI-   female never smoker followed for OSA complicated by asthma/COPD (Dr. Marchelle Gearing), allergic rhinitis (Dr. [place] Callas), gout NPSG 07/21/17-AHI 15.2/hour, desaturation to 84%, body weight 210 pounds PFT 08/08/2017-mild obstruction with significant response to bronchodilator, mild diffusion reduction.  FVC 1.85/72%, FEV1 1.39/73%, ratio 0.75, TLC 86%, DLCO 72%  -----------------------------------------------------------------------------------------  10/22/2017-73 year old female never smoker followed for OSA complicated by asthma/COPD (Dr. Marchelle Gearing), allergic rhinitis (Dr. [place] Callas), gout NPSG 07/21/17-AHI 15.2/hour, desaturation to 84%, body weight 210 pounds OSA: DME: [facility] Pt wears CPAP nighlty with auto 5-20 settings; DL attached.  CPAP auto 5-20/[facility] PFT 08/08/2017-mild obstruction with significant response to bronchodilator, mild diffusion reduction.  FVC 1.85/72%, FEV1 1.39/73%, ratio 0.75, TLC 86%, DLCO 72% Body weight today 203 pounds She has been on CPAP/BiPAP since 2006.  After update sleep study in July she is using CPAP with AutoPap and is quite comfortable with this.  Nasal mask.  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 Download 100% compliance AHI 2.8/hour. Followed by Dr. Marchelle Gearing for her asthma/COPD-feels clear today.  Note PFT in July.  10/23/2018- 73 year old female never smoker followed for OSA complicated by asthma/COPD (Dr. Marchelle Gearing), allergic rhinitis (Dr.  Callas), gout CPAP auto 5-20/ [facility]    Night Guard Mouthpiece for bruxism Body weight today 195 lbs Download compliance 100%, AHI 2/ hr -----OSA on CPAP, DME: [facility]; no complaints Symbicort 160, Spiriva, theophyline 400, neb Duoneb, Flonase, zyrtec, albuterol hfa Wants to defer Flu vax today- plans to get it elsewhere Reviewed download. She is satisfied to continue CPAP. PCP checks her theophylline level as she usually takes 800 mg daily and says it helps. Rare need for rescue  inhaler. Asks refills.  ROS-see HPI   + = positive Constitutional:    weight loss, night sweats, fevers, chills, fatigue, lassitude. HEENT:    headaches, difficulty swallowing, tooth/dental problems, sore throat,       sneezing, itching, ear ache, nasal congestion, post nasal drip, snoring CV:    chest pain, orthopnea, PND, swelling in lower extremities, anasarca,                                                      dizziness, palpitations Resp:   shortness of breath with exertion or at rest.                productive cough,   non-productive cough, coughing up of blood.              change in color of mucus.  wheezing.   Skin:    rash or lesions. GI:  No-   heartburn, indigestion, abdominal pain, nausea, vomiting, diarrhea,                 change in bowel habits, loss of appetite GU: dysuria, change in color of urine, no urgency or frequency.   flank pain. MS:   joint pain, stiffness, decreased range of motion, back pain. Neuro-     nothing unusual Psych:  change in mood or affect.  depression or anxiety.   memory loss.  OBJ- Physical Exam General- Alert, Oriented, Affect-appropriate, Distress- none acute, + obese Skin- rash-none, lesions- none, excoriation- none Lymphadenopathy- none Head- atraumatic            Eyes- Gross vision intact, PERRLA, conjunctivae and secretions clear  Ears-+ bilateral hearing aids            Nose- Clear, no-Septal dev, mucus, polyps, erosion, perforation             Throat- Mallampati IV , mucosa clear , drainage- none, tonsils- atrophic Neck- flexible , trachea midline, no stridor , thyroid nl, carotid no bruit Chest - symmetrical excursion , unlabored           Heart/CV- RRR , no murmur , no gallop  , no rub, nl s1 s2                           - JVD- none , edema- none, stasis changes- none, varices- none           Lung- clear to P&A, wheeze- none, cough- none , dullness-none, rub- none           Chest wall-  Abd-  Br/ Gen/ Rectal- Not  done, not indicated Extrem- cyanosis- none, clubbing, none, atrophy- none, strength- nl Neuro- grossly intact to observation

## 2018-10-23 NOTE — Assessment & Plan Note (Signed)
Feels well controlled and denies exacerbation. Tolerating theophylline well and wants to continue. Plan- med refills.

## 2018-10-23 NOTE — Patient Instructions (Signed)
Med refills sent  We can continue CPAP auto 5-20, mask of choice, humidifier, supplies, Airview/ card  Please call if we can help

## 2018-10-23 NOTE — Assessment & Plan Note (Signed)
Benefits from CPAP with good compliance and control Plan- continue CPAP auto 5-20 

## 2018-11-10 ENCOUNTER — Other Ambulatory Visit: Payer: Self-pay

## 2018-11-10 ENCOUNTER — Encounter: Payer: Self-pay | Admitting: Podiatry

## 2018-11-10 ENCOUNTER — Ambulatory Visit (INDEPENDENT_AMBULATORY_CARE_PROVIDER_SITE_OTHER): Payer: Medicare Other | Admitting: Podiatry

## 2018-11-10 DIAGNOSIS — L84 Corns and callosities: Secondary | ICD-10-CM

## 2018-11-10 DIAGNOSIS — T300 Burn of unspecified body region, unspecified degree: Secondary | ICD-10-CM

## 2018-11-10 NOTE — Progress Notes (Signed)
This patient  presents to the office with chief complaint of a painful corn that has developed between her second and third toes, left foot.   .  She says that she has applied acid to skin lesions between the second and third toes left foot.  She tells Ammie  that I told her to use acid to eliminate the corns.  I do not tell any patients to utilize acid to treat their corns/calluses.  She says she is also developed additional skin lesions after usage of the acid.  She says the acid was used 1 week ago and she discontinued any usage since it caused more pain than she had prior to using the acid.   she presents the office today for an evaluation and treatment of her painful calluses.  Vascular  Dorsalis pedis and posterior tibial pulses are palpable  B/L.  Capillary return  WNL.  Temperature gradient is  WNL.  Skin turgor  WNL  Sensorium  Senn Weinstein monofilament wire  WNL. Normal tactile sensation.  Nail Exam  Patient has normal nails with no evidence of bacterial or fungal infection.  Orthopedic  Exam  Muscle tone and muscle strength  WNL.  No limitations of motion feet  B/L.  No crepitus or joint effusion noted.  Foot type is unremarkable and digits show no abnormalities.  Bony prominence of fifth metatarsal left foot. Contracted digits feet  B/L.   Skin  No open lesions.  Normal skin texture and turgor. Corn 2/3 left foot.     Painful corns 2/3 left foot.   Debride corn 2/3 left foot.  Discussed this condition with this patient.  Offered her an evaluation with one of the younger doctors in the office to determine if she is a surgical candidate for the correction of the second and third digits left foot.  RTC prn.  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   Gardiner Barefoot DPM

## 2018-11-24 ENCOUNTER — Other Ambulatory Visit: Payer: Self-pay

## 2018-11-24 ENCOUNTER — Ambulatory Visit (INDEPENDENT_AMBULATORY_CARE_PROVIDER_SITE_OTHER): Payer: Medicare Other

## 2018-11-24 ENCOUNTER — Encounter: Payer: Self-pay | Admitting: Sports Medicine

## 2018-11-24 ENCOUNTER — Ambulatory Visit (INDEPENDENT_AMBULATORY_CARE_PROVIDER_SITE_OTHER): Payer: Medicare Other | Admitting: Sports Medicine

## 2018-11-24 DIAGNOSIS — M2032 Hallux varus (acquired), left foot: Secondary | ICD-10-CM | POA: Diagnosis not present

## 2018-11-24 DIAGNOSIS — M109 Gout, unspecified: Secondary | ICD-10-CM | POA: Diagnosis not present

## 2018-11-24 DIAGNOSIS — M2060 Acquired deformities of toe(s), unspecified, unspecified foot: Secondary | ICD-10-CM

## 2018-11-24 DIAGNOSIS — I2510 Atherosclerotic heart disease of native coronary artery without angina pectoris: Secondary | ICD-10-CM | POA: Diagnosis not present

## 2018-11-24 DIAGNOSIS — M2041 Other hammer toe(s) (acquired), right foot: Secondary | ICD-10-CM

## 2018-11-24 DIAGNOSIS — L84 Corns and callosities: Secondary | ICD-10-CM | POA: Diagnosis not present

## 2018-11-24 DIAGNOSIS — M2042 Other hammer toe(s) (acquired), left foot: Secondary | ICD-10-CM | POA: Diagnosis not present

## 2018-11-24 NOTE — Progress Notes (Signed)
Subjective: Diana GurneySusan [name] is a 73 y.o. female patient who presents to office for evaluation of Left>Right foot pain. Patient complains of pain off and on at the corns to her toes on the left foot especially at 2-3 toes worse when the corns build up or when the toes rub and when she is in shoes.  Patient reports in the past has had multiple surgeries and has had conservative care of Dr. Stacie AcresMayer trimming her corns and calluses and was referred to see me for possible surgery consult.  Patient denies any other pedal complaints.   Review of Systems  All other systems reviewed and are negative.    Patient Active Problem List   Diagnosis Date Noted  . Burn 11/10/2018  . Severe obesity (BMI 35.0-39.9) with comorbidity (HCC) 12/15/2017  . Asthma with COPD (HCC) 10/22/2017  . Type 2 diabetes mellitus without complication, without long-term current use of insulin (HCC) 08/02/2017  . Obstructive sleep apnea 06/30/2017  . Insomnia 06/30/2017  . Coronary artery disease involving native coronary artery of native heart without angina pectoris 12/12/2015  . Mixed hyperlipidemia 12/12/2015  . Acquired hypothyroidism 03/17/2014  . Gout 03/17/2014  . Mild persistent asthma without complication 03/17/2014  . Epiretinal membrane 07/27/2012  . Open angle with borderline findings, low risk 07/27/2012  . Overactive bladder 08/12/2011  . Pap smear, as part of routine gynecological examination 08/12/2011  . RLS (restless legs syndrome) 08/12/2011  . Visit for gynecologic examination 08/12/2011  . OA (osteoarthritis) 07/18/2011  . Cellulitis of right leg 07/13/2011  . Right leg pain 07/08/2011  . Ruptured Bakers cyst 07/08/2011  . Left bundle-branch block, unspecified 07/22/2003    Current Outpatient Medications on File Prior to Visit  Medication Sig Dispense Refill  . albuterol (PROVENTIL HFA;VENTOLIN HFA) 108 (90 Base) MCG/ACT inhaler Inhale 2 puffs into the lungs every 6 (six) hours as needed for wheezing  or shortness of breath. 1 Inhaler 3  . allopurinol (ZYLOPRIM) 300 MG tablet Take 300 mg by mouth daily.    Marland Kitchen. aspirin EC 81 MG tablet Take 81 mg by mouth daily.    . budesonide-formoterol (SYMBICORT) 160-4.5 MCG/ACT inhaler Inhale 2 puffs into the lungs 2 (two) times daily. 3 Inhaler 4  . CALCIUM-MAGNESIUM PO Take 1 tablet by mouth daily.    . cetirizine (ZYRTEC) 10 MG tablet Take 10 mg by mouth daily.    . cholecalciferol (VITAMIN D) 1000 units tablet Take 1,000 Units by mouth daily.    . diazepam (VALIUM) 5 MG tablet Take 5 mg by mouth every 6 (six) hours as needed for anxiety.    . diphenhydrAMINE (BENADRYL) 25 mg capsule Take 25 mg by mouth every 6 (six) hours as needed for allergies.    Marland Kitchen. EPINEPHrine (EPIPEN 2-PAK) 0.3 mg/0.3 mL IJ SOAJ injection Inject 0.3 mg into the muscle as needed (anaphylaxis).    . fluticasone (FLONASE) 50 MCG/ACT nasal spray 2 puffs each nostril daily as needed 48 mL 4  . FOLIC ACID PO Take 1 tablet by mouth daily.    . furosemide (LASIX) 20 MG tablet Take 20 mg by mouth daily.    Marland Kitchen. ipratropium-albuterol (DUONEB) 0.5-2.5 (3) MG/3ML SOLN Take 3 mLs by nebulization every 6 (six) hours as needed (shortness of breath). 360 mL 3  . levothyroxine (SYNTHROID) 50 MCG tablet TAKE 1 TABLET BY MOUTH DAILY    . metFORMIN (GLUCOPHAGE) 500 MG tablet Take 500 mg by mouth 2 (two) times daily with a meal.    .  montelukast (SINGULAIR) 10 MG tablet Take 1 tablet (10 mg total) by mouth at bedtime. 30 tablet 3  . Multiple Vitamins-Minerals (MULTIVITAMIN WITH MINERALS) tablet Take 1 tablet by mouth daily.    . niacin (NIASPAN) 500 MG CR tablet   1  . niacin 500 MG tablet Take 500 mg by mouth at bedtime.    . OMEGA-3 FATTY ACIDS PO Take 1 capsule by mouth daily.    . potassium chloride SA (K-DUR,KLOR-CON) 20 MEQ tablet Take 20 mEq by mouth daily.    . simvastatin (ZOCOR) 20 MG tablet TK 1 T PO HS  1  . temazepam (RESTORIL) 30 MG capsule     . theophylline (UNIPHYL) 400 MG 24 hr tablet 2  daily 180 tablet 4  . Tiotropium Bromide Monohydrate (SPIRIVA RESPIMAT) 1.25 MCG/ACT AERS Inhale 2 puffs into the lungs daily. 12 g 4  . vitamin B-12 (CYANOCOBALAMIN) 1000 MCG tablet Take 1,000 mcg by mouth daily.    . vitamin E 400 UNIT capsule Take 400 Units by mouth daily.     No current facility-administered medications on file prior to visit.     Allergies  Allergen Reactions  . Molds & Smuts Shortness Of Breath  . Other Shortness Of Breath    CAT  . Pollen Extract Shortness Of Breath   Family History  Problem Relation Age of Onset  . Heart disease Mother   . Stroke Brother     Social History   Socioeconomic History  . Marital status: Married    Spouse name: Not on file  . Number of children: Not on file  . Years of education: Not on file  . Highest education level: Not on file  Occupational History  . Not on file  Social Needs  . Financial resource strain: Not on file  . Food insecurity    Worry: Not on file    Inability: Not on file  . Transportation needs    Medical: Not on file    Non-medical: Not on file  Tobacco Use  . Smoking status: Never Smoker  . Smokeless tobacco: Never Used  Substance and Sexual Activity  . Alcohol use: No  . Drug use: No  . Sexual activity: Not on file  Lifestyle  . Physical activity    Days per week: Not on file    Minutes per session: Not on file  . Stress: Not on file  Relationships  . Social Herbalist on phone: Not on file    Gets together: Not on file    Attends religious service: Not on file    Active member of club or organization: Not on file    Attends meetings of clubs or organizations: Not on file    Relationship status: Not on file  Other Topics Concern  . Not on file  Social History Narrative  . Not on file    Objective:  General: Alert and oriented x3 in no acute distress  Dermatology: Small hyperkeratotic lesion at lateral left second toe and medial left third toe.  No open lesions  bilateral lower extremities, no webspace macerations, no ecchymosis bilateral, all nails x 10 are well manicured.  Vascular: Dorsalis Pedis and Posterior Tibial pedal pulses 1/4, Capillary Fill Time 5 seconds,(+) pedal hair growth bilateral, no edema bilateral lower extremities, varicosities bilateral, temperature gradient within normal limits.  Neurology: Johney Maine sensation intact via light touch bilateral.  Musculoskeletal: Marked digital deformity with rigid contracture noted at the second and third  toes on the right and flexible digital deviation of medial subluxation noted on all digits of the left foot on toes 2 through 5 however there is varus or medial deviation noted to the hallux on the left.  Pes planus deformity.  Strength within normal limits in all groups bilateral.   Gait: Unassisted, Non-antalgic.  Xrays  Right/Left Foot    Impression: Decreased osseous mineralization, there is significant digital deformity with subluxation at the level of the metatarsophalangeal joint and diffuse arthritis bilateral.       Assessment and Plan: Problem List Items Addressed This Visit      Other   Gout   Relevant Orders   ANA, IFA Comprehensive Panel   Rheumatoid factor   Sedimentation rate   Uric acid   C-reactive protein   CBC with Differential   Basic Metabolic Panel    Other Visit Diagnoses    Hammer toes of both feet    -  Primary   Relevant Orders   DG Foot Complete Right (Completed)   DG Foot Complete Left (Completed)   Corns and callosities       Acquired hallux varus of left foot       Toe deformity, unspecified laterality           -Complete examination performed -Xrays reviewed -Discussed treatment options for digital deformity -Rx arthritic panel -Mechanically debrided corns at left second and third toes using sterile chisel blade without incident and dispensed toe spacers advised patient to continue with these toe spacers until next visit then we will discuss to see  if we should proceed forward with surgery.  I advised patient that surgery for this deformity could be very extensive and require a significant recovery and downtime and that at her age she may need assistance with keeping pressure off the foot and assistance at home and even home nursing to help her recover properly to ensure that we have a successful surgical result. -I also reviewed previous x-rays that she had since 1984 on plain film -Patient to return to office after lab work or sooner if condition worsens.  Asencion Islam, DPM

## 2018-11-26 LAB — ANTI-NUCLEAR AB-TITER (ANA TITER)
ANA TITER: 1:40 {titer} — ABNORMAL HIGH
ANA Titer 1: 1:40 {titer} — ABNORMAL HIGH

## 2018-11-26 LAB — CBC WITH DIFFERENTIAL/PLATELET
Absolute Monocytes: 969 cells/uL — ABNORMAL HIGH (ref 200–950)
Basophils Absolute: 60 cells/uL (ref 0–200)
Basophils Relative: 0.7 %
Eosinophils Absolute: 340 cells/uL (ref 15–500)
Eosinophils Relative: 4 %
HCT: 44.1 % (ref 35.0–45.0)
Hemoglobin: 15 g/dL (ref 11.7–15.5)
Lymphs Abs: 2049 cells/uL (ref 850–3900)
MCH: 34.2 pg — ABNORMAL HIGH (ref 27.0–33.0)
MCHC: 34 g/dL (ref 32.0–36.0)
MCV: 100.5 fL — ABNORMAL HIGH (ref 80.0–100.0)
MPV: 9.9 fL (ref 7.5–12.5)
Monocytes Relative: 11.4 %
Neutro Abs: 5083 cells/uL (ref 1500–7800)
Neutrophils Relative %: 59.8 %
Platelets: 259 10*3/uL (ref 140–400)
RBC: 4.39 10*6/uL (ref 3.80–5.10)
RDW: 12.5 % (ref 11.0–15.0)
Total Lymphocyte: 24.1 %
WBC: 8.5 10*3/uL (ref 3.8–10.8)

## 2018-11-26 LAB — ANA, IFA COMPREHENSIVE PANEL
Anti Nuclear Antibody (ANA): POSITIVE — AB
ENA SM Ab Ser-aCnc: <1 AI
SM/RNP: <1 AI
SSA (Ro) (ENA) Antibody, IgG: <1 AI
SSB (La) (ENA) Antibody, IgG: <1 AI
Scleroderma (Scl-70) (ENA) Antibody, IgG: <1 AI
ds DNA Ab: <1 IU/mL

## 2018-11-26 LAB — BASIC METABOLIC PANEL
BUN: 16 mg/dL (ref 7–25)
CO2: 31 mmol/L (ref 20–32)
Calcium: 10.5 mg/dL — ABNORMAL HIGH (ref 8.6–10.4)
Chloride: 104 mmol/L (ref 98–110)
Creat: 0.91 mg/dL (ref 0.60–0.93)
Glucose, Bld: 141 mg/dL — ABNORMAL HIGH (ref 65–99)
Potassium: 3.8 mmol/L (ref 3.5–5.3)
Sodium: 144 mmol/L (ref 135–146)

## 2018-11-26 LAB — C-REACTIVE PROTEIN: CRP: 1.3 mg/L (ref <8.0)

## 2018-11-26 LAB — URIC ACID: Uric Acid, Serum: 5.8 mg/dL (ref 2.5–7.0)

## 2018-11-26 LAB — RHEUMATOID FACTOR: Rheumatoid fact SerPl-aCnc: <14 IU/mL (ref <14)

## 2018-11-26 LAB — SEDIMENTATION RATE: Sed Rate: 9 mm/h (ref 0–30)

## 2018-11-30 ENCOUNTER — Telehealth: Payer: Self-pay | Admitting: *Deleted

## 2018-11-30 NOTE — Telephone Encounter (Signed)
I informed pt of Dr. Leeanne Rio review of results and referral to [place] - Dr. Gavin Pound. Faxed required form, clinicals and demographics to [facility].

## 2018-11-30 NOTE — Telephone Encounter (Signed)
-----   Message from Landis Martins, [place] sent at 11/26/2018  6:43 PM EST ----- Please inform patient that her arthritic panel was positive.  Patient needs to follow-up with rheumatology for further evaluation. Thanks Dr. Cannon Kettle

## 2019-03-26 ENCOUNTER — Other Ambulatory Visit: Payer: Self-pay | Admitting: Internal Medicine

## 2019-03-29 ENCOUNTER — Telehealth: Payer: Self-pay | Admitting: Internal Medicine

## 2019-03-29 NOTE — Telephone Encounter (Signed)
Pt's fluticasone rx has been sent to preferred pharmacy for pt. Called and spoke with pt letting her know this had been done and she verbalized understanding. Nothing further needed.

## 2019-06-11 IMAGING — CT CT CHEST HIGH RESOLUTION W/O CM
2 of 6 series · 15 of 36 positions shown, 18 images · non-contrast
Comparison: No priors.

CLINICAL DATA: 72-year-old female with history of chronic shortness
of breath.

EXAM:
CT CHEST WITHOUT CONTRAST
TECHNIQUE: Multidetector CT imaging of the chest was performed following the
standard protocol without intravenous contrast. High resolution
imaging of the lungs, as well as inspiratory and expiratory imaging,
was performed.

[Series 2: high resolution · axial · 0.69mm/px · z∈[-299,-33]mm · 12 of 148 slices shown, 15 images]
[im 8/148  mediastinal]
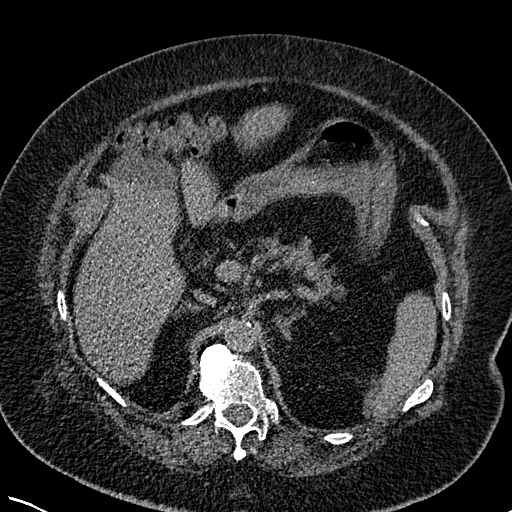
[im 8/148  lung]
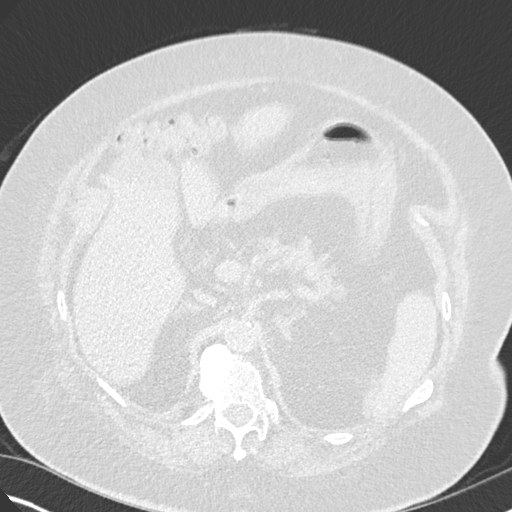
[im 22/148  lung]
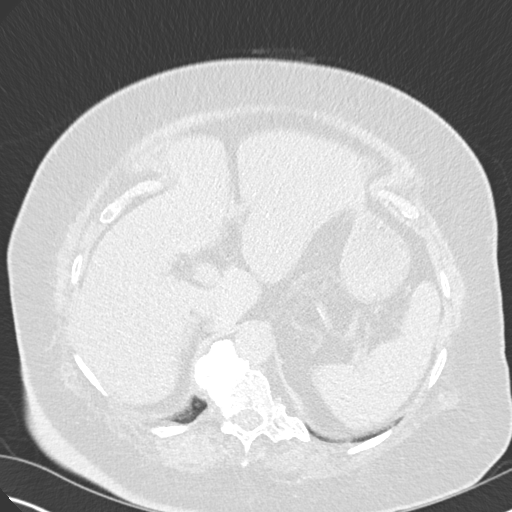
[im 36/148  lung]
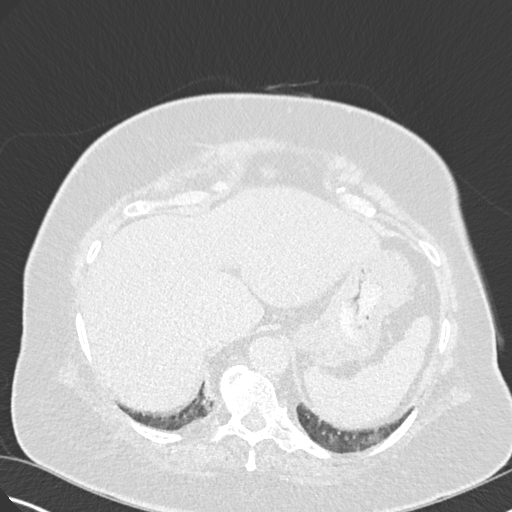
[im 43/148  lung]
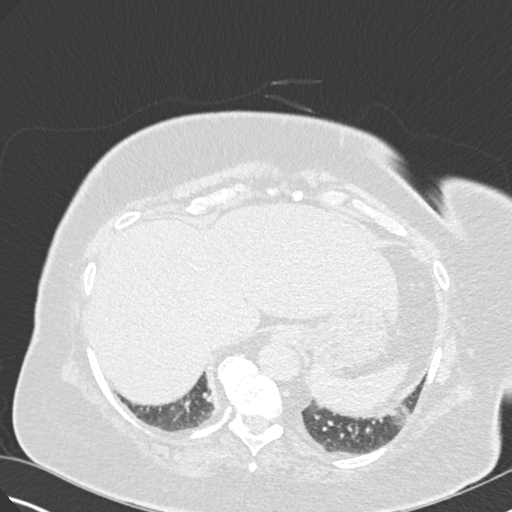
[im 57/148  mediastinal]
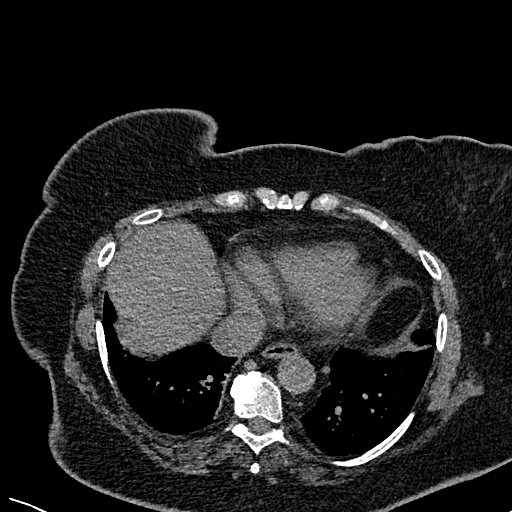
[im 57/148  lung]
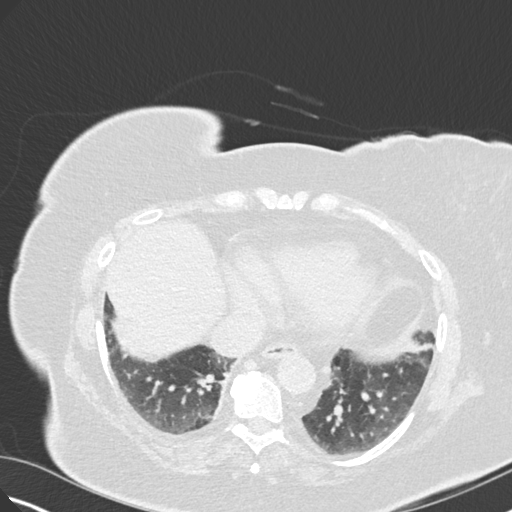
[im 71/148  lung]
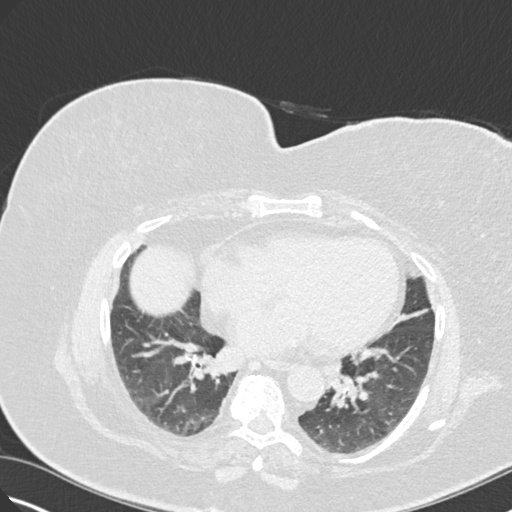
[im 78/148  lung]
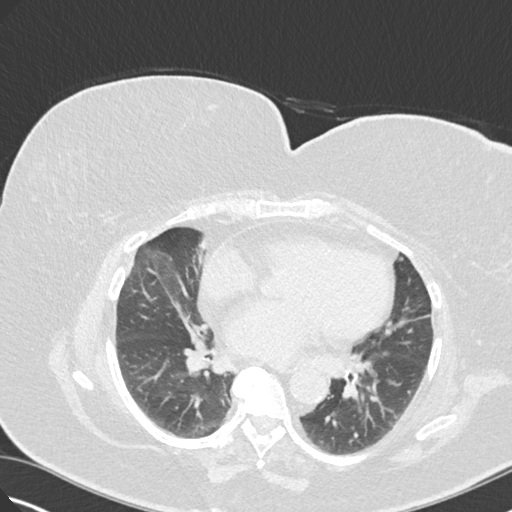
[im 92/148  lung]
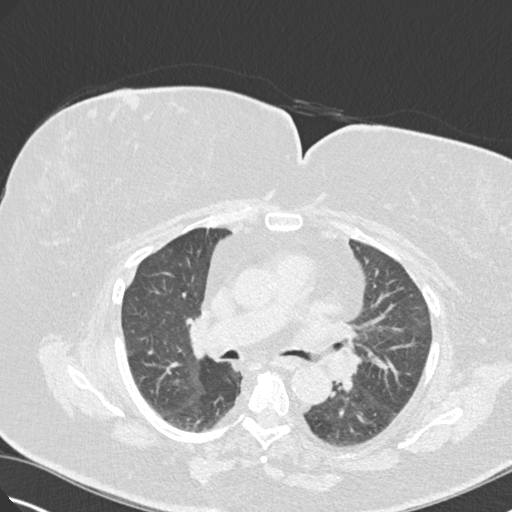
[im 106/148  mediastinal]
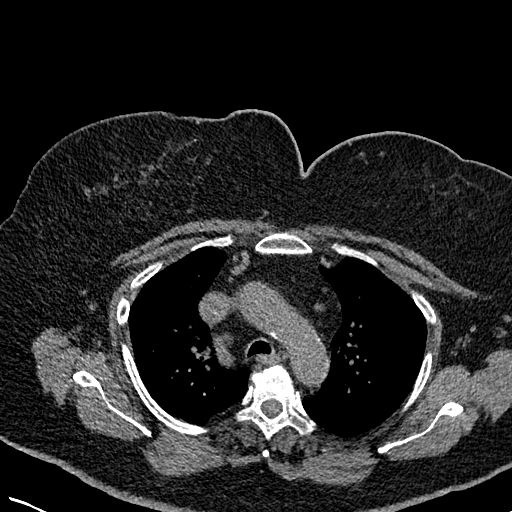
[im 106/148  lung]
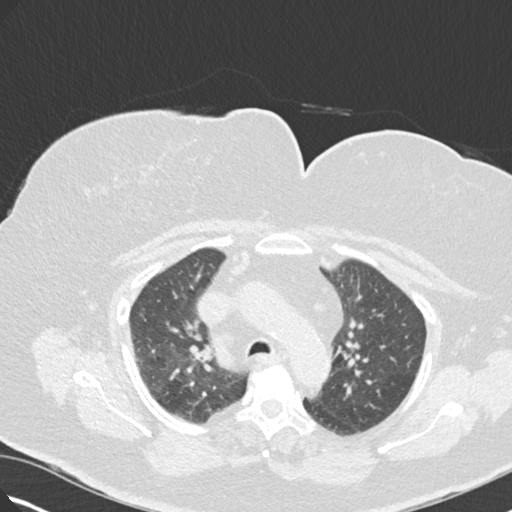
[im 113/148  lung]
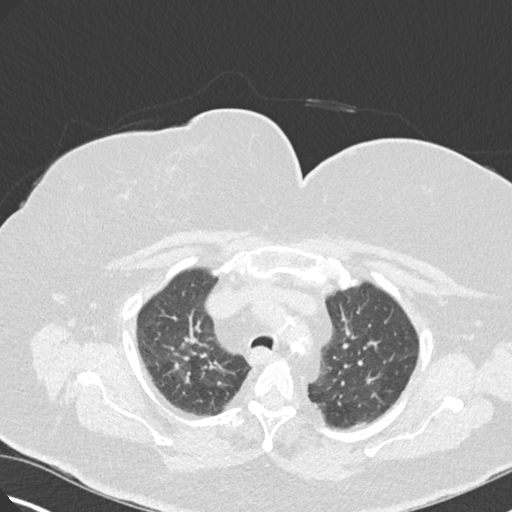
[im 127/148  lung]
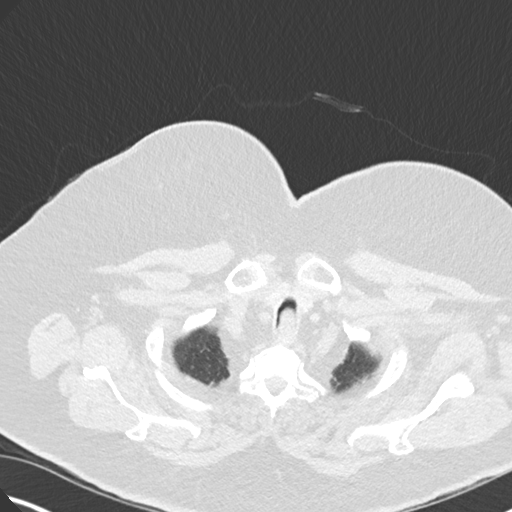
[im 141/148  lung]
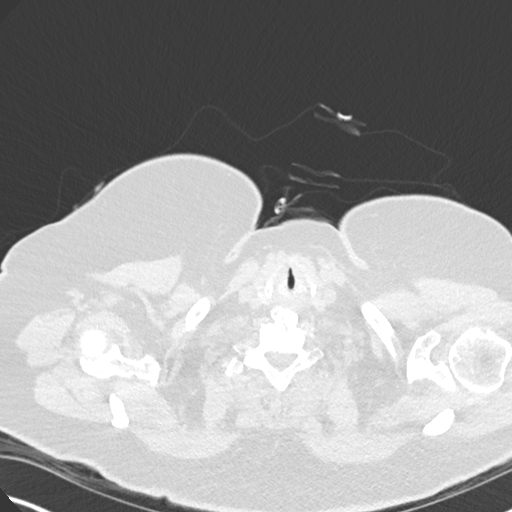

[Series 10: coronal · coronal · 0.59mm/px · 3 of 128 slices shown]
[im 26/128  lung]
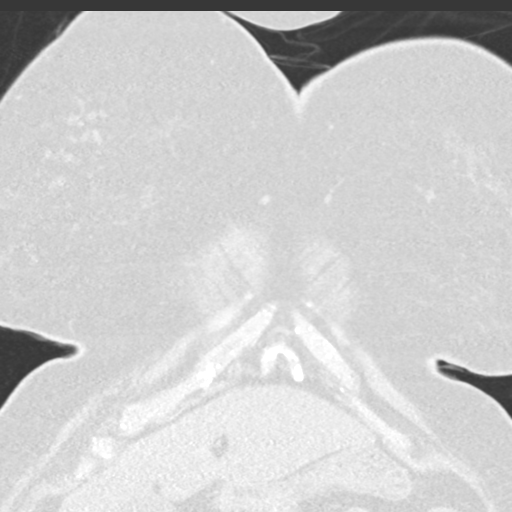
[im 51/128  lung]
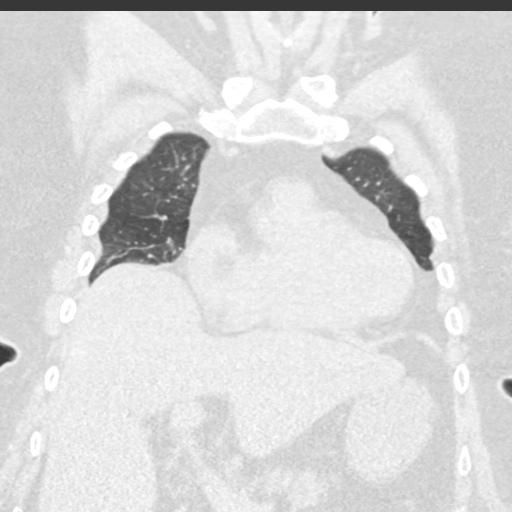
[im 77/128  lung]
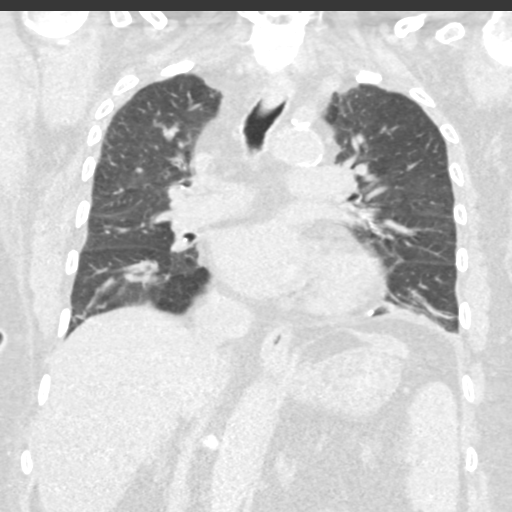

[15 of 36 positions shown; findings below may reference images not displayed]

FINDINGS: Cardiovascular: Heart size is borderline enlarged. There is no
significant pericardial fluid, thickening or pericardial
calcification. There is aortic atherosclerosis, as well as
atherosclerosis of the great vessels of the mediastinum and the
coronary arteries, including calcified atherosclerotic plaque in the
left anterior descending, left circumflex and right coronary
arteries.

Mediastinum/Nodes: No pathologically enlarged mediastinal or hilar
lymph nodes. Please note that accurate exclusion of hilar adenopathy
is limited on noncontrast CT scans. Esophagus is unremarkable in
appearance. No axillary lymphadenopathy.

Lungs/Pleura: High-resolution images demonstrate no significant
regions of ground-glass attenuation, subpleural reticulation,
parenchymal banding, traction bronchiectasis or frank honeycombing.
Inspiratory and expiratory imaging is unremarkable. No acute
consolidative airspace disease. No pleural effusions. A few
scattered subpleural pulmonary nodules measuring 5 mm or less in
size are noted. Small calcified granuloma in the left lower lobe. No
larger more suspicious appearing pulmonary nodules or masses are
noted.

Upper Abdomen: Diffuse low attenuation noted throughout the
visualized portions of the hepatic parenchyma, indicative of hepatic
steatosis. Aortic atherosclerosis.

Musculoskeletal: There are no aggressive appearing lytic or blastic
lesions noted in the visualized portions of the skeleton.
IMPRESSION: 1. No evidence of interstitial lung disease.
2. No acute findings are noted in the thorax.
3. Small pulmonary nodules measuring 5 mm or less in size. These
findings are nonspecific but are statistically likely benign. No
follow-up needed if patient is low-risk (and has no known or
suspected primary neoplasm). Non-contrast chest CT can be considered
in 12 months if patient is high-risk. This recommendation follows
the consensus statement: Guidelines for Management of Incidental
Pulmonary Nodules Detected on CT Images: From the [HOSPITAL]
4. Aortic atherosclerosis, in addition to 3 vessel coronary artery
disease. Please note that although the presence of coronary artery
calcium documents the presence of coronary artery disease, the
severity of this disease and any potential stenosis cannot be
assessed on this non-gated CT examination. Assessment for potential
risk factor modification, dietary therapy or pharmacologic therapy
may be warranted, if clinically indicated.
5. Hepatic steatosis.

Aortic Atherosclerosis (VG7IH-BFA.A).

## 2019-06-30 IMAGING — DX DG CHEST 2V
2 series · 2 of 2 positions shown · non-contrast
Comparison: CT chest 07/08/2017.

CLINICAL DATA: Weakness and dizziness for 2 days.

EXAM:
CHEST - 2 VIEW

[w chest lat]
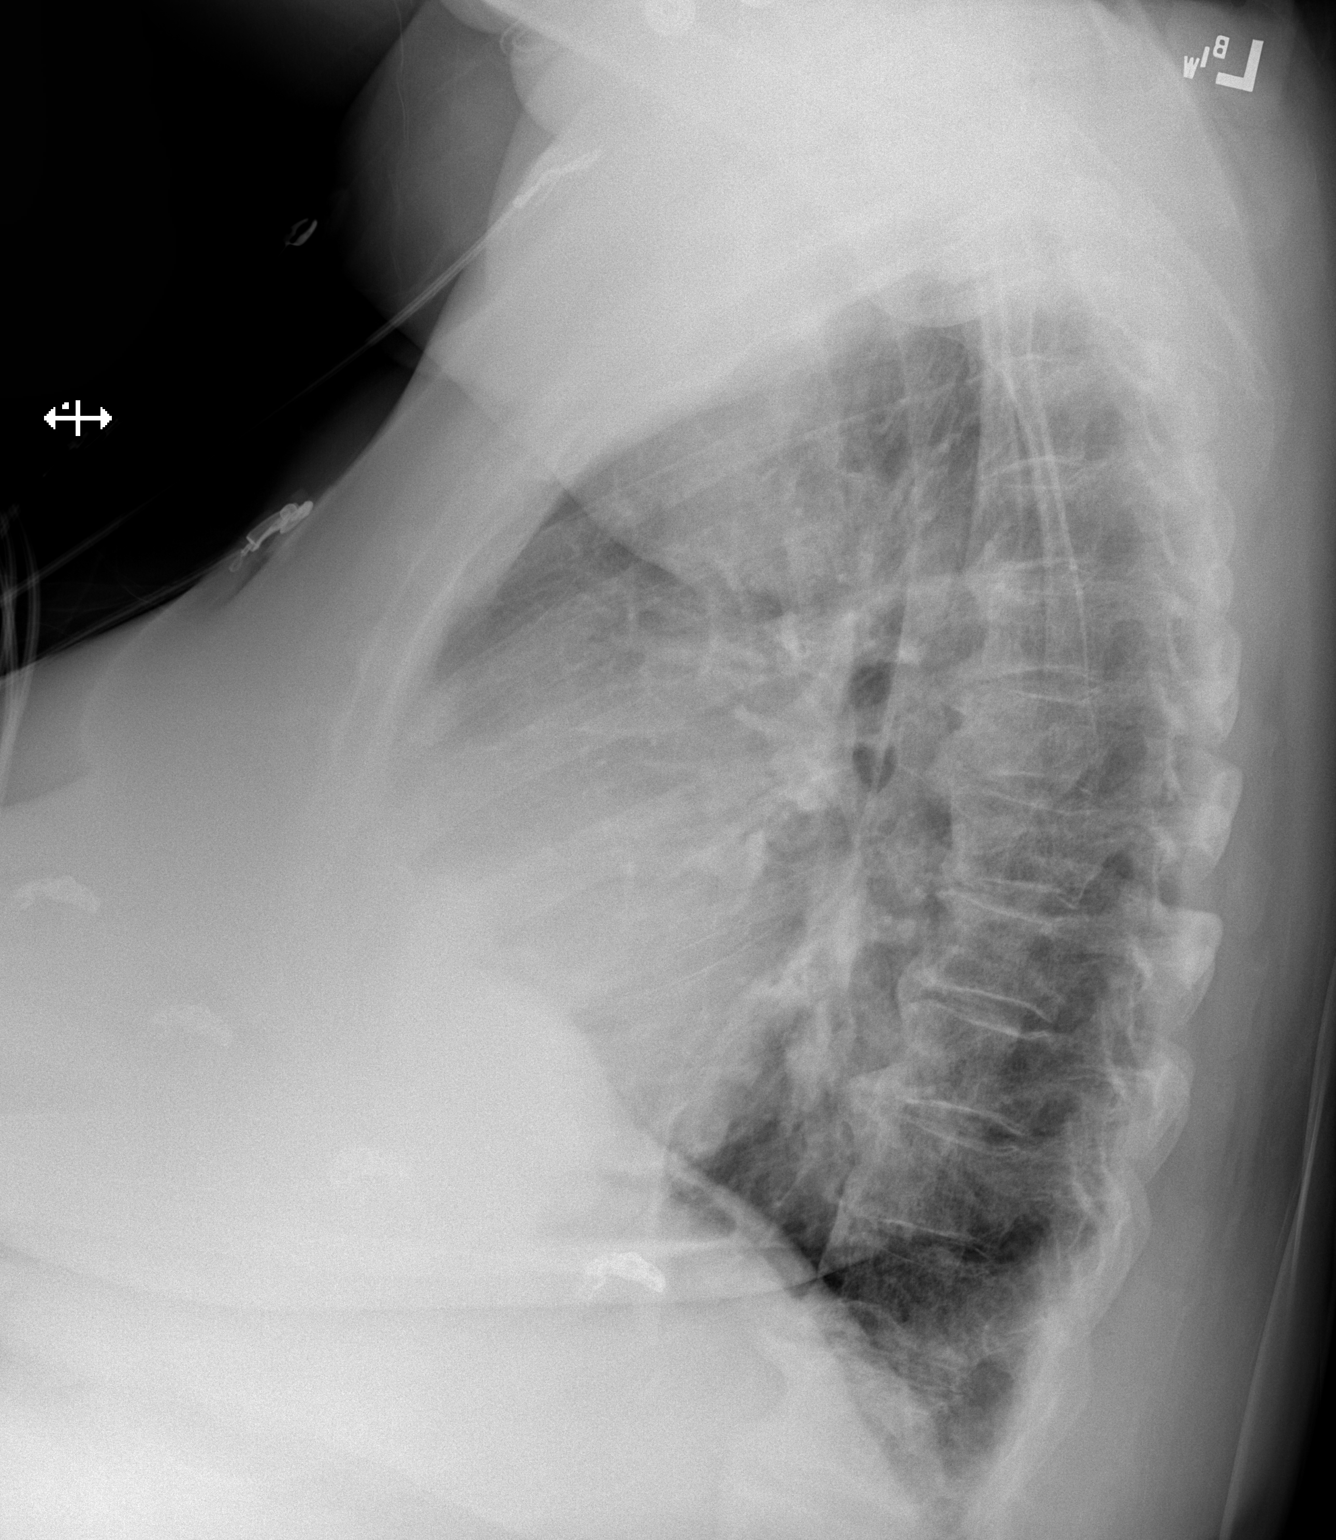

[x chest ap]
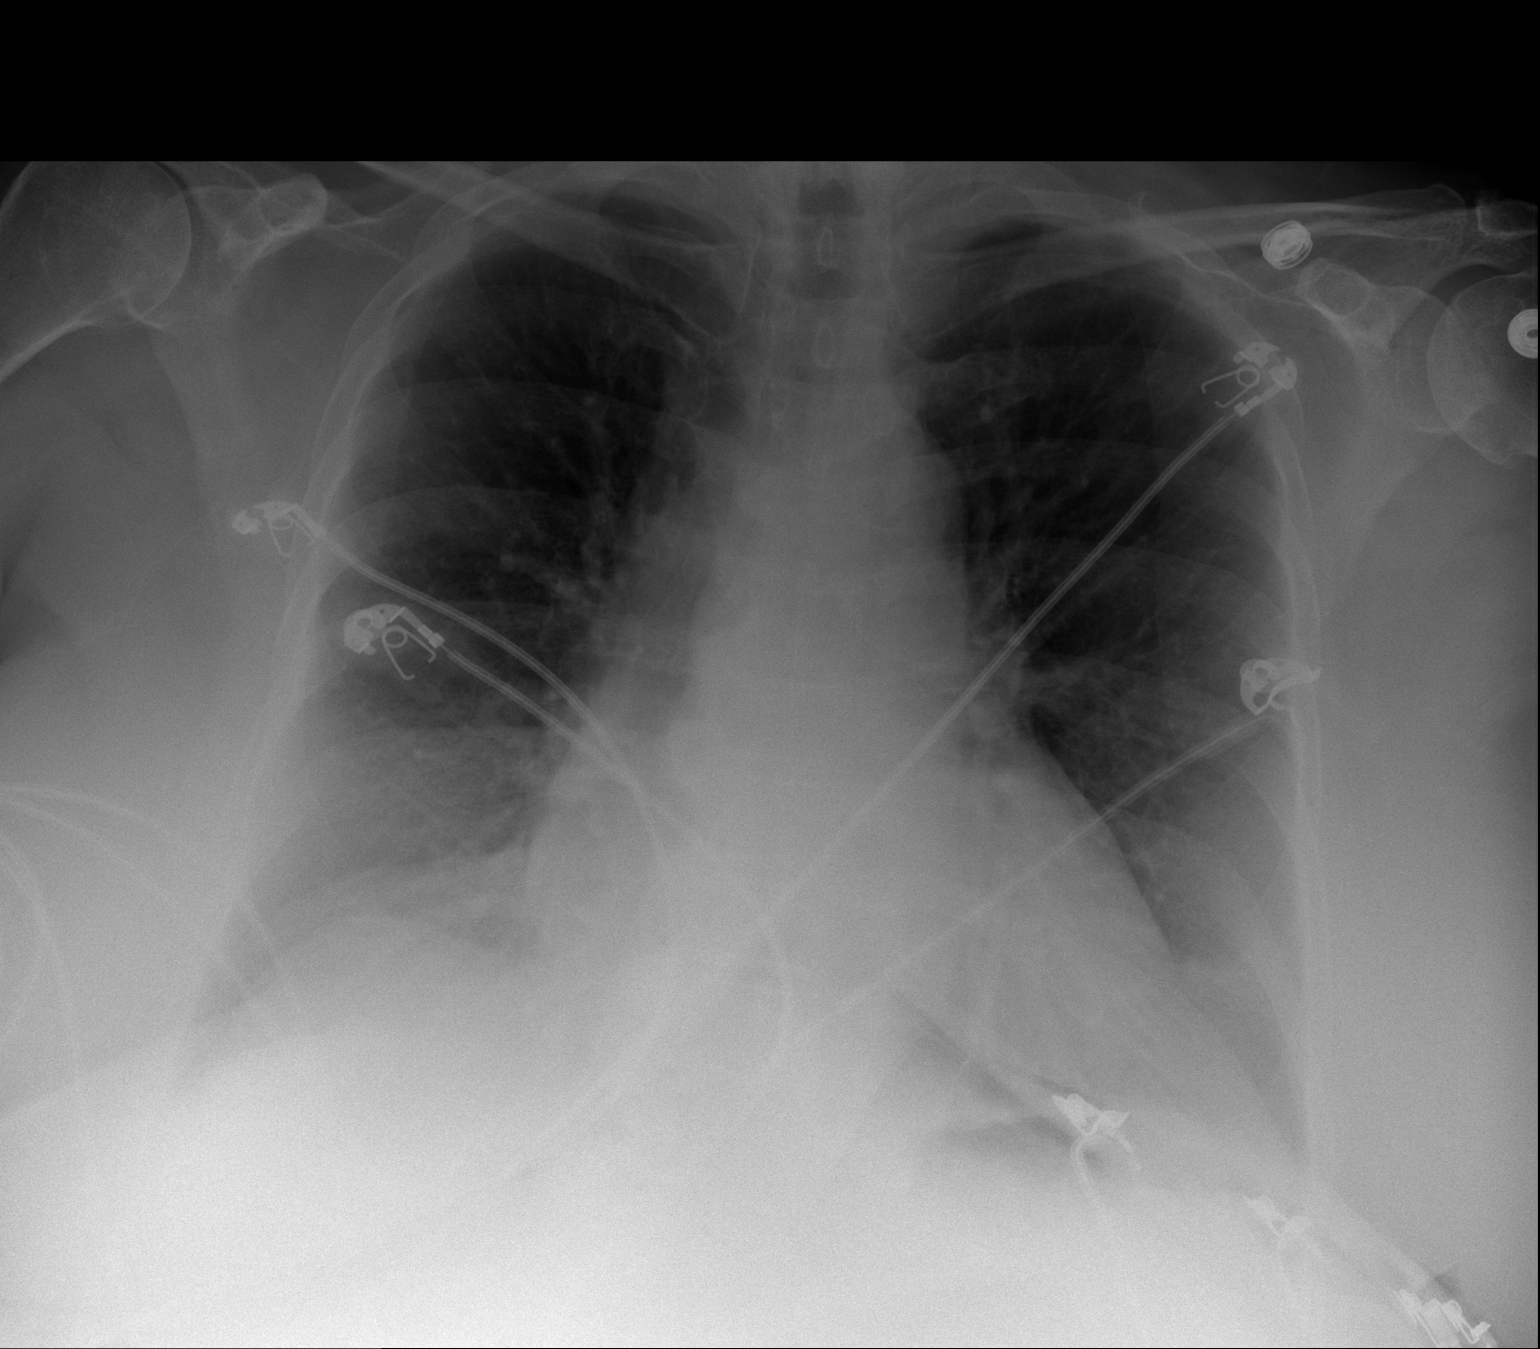

[2 of 2 positions shown; findings below may reference images not displayed]

FINDINGS: The lungs are clear. There is cardiomegaly. Aortic atherosclerosis
is noted. No pneumothorax or pleural effusion. No acute focal bony
abnormality.
IMPRESSION: No acute disease.

Cardiomegaly.

Atherosclerosis.

## 2019-07-05 ENCOUNTER — Other Ambulatory Visit: Payer: Self-pay | Admitting: Family Medicine

## 2019-07-05 DIAGNOSIS — Z1231 Encounter for screening mammogram for malignant neoplasm of breast: Secondary | ICD-10-CM

## 2019-07-09 ENCOUNTER — Ambulatory Visit
Admission: RE | Admit: 2019-07-09 | Discharge: 2019-07-09 | Disposition: A | Discharge status: Home / Self Care | Payer: Medicare Other | Source: Ambulatory Visit | Attending: Family Medicine | Admitting: Family Medicine

## 2019-07-09 ENCOUNTER — Other Ambulatory Visit: Payer: Self-pay

## 2019-07-09 DIAGNOSIS — Z1231 Encounter for screening mammogram for malignant neoplasm of breast: Secondary | ICD-10-CM

## 2019-10-15 ENCOUNTER — Other Ambulatory Visit: Payer: Self-pay | Admitting: Internal Medicine

## 2019-10-29 ENCOUNTER — Other Ambulatory Visit: Payer: Self-pay | Admitting: Internal Medicine

## 2019-10-30 ENCOUNTER — Other Ambulatory Visit: Payer: Self-pay | Admitting: Internal Medicine

## 2019-11-02 ENCOUNTER — Encounter: Payer: Self-pay | Admitting: Internal Medicine

## 2019-11-02 ENCOUNTER — Other Ambulatory Visit: Payer: Self-pay

## 2019-11-02 ENCOUNTER — Ambulatory Visit (INDEPENDENT_AMBULATORY_CARE_PROVIDER_SITE_OTHER): Payer: Medicare Other | Admitting: Internal Medicine

## 2019-11-02 DIAGNOSIS — J310 Chronic rhinitis: Secondary | ICD-10-CM | POA: Diagnosis not present

## 2019-11-02 DIAGNOSIS — G4733 Obstructive sleep apnea (adult) (pediatric): Secondary | ICD-10-CM | POA: Diagnosis not present

## 2019-11-02 DIAGNOSIS — J449 Chronic obstructive pulmonary disease, unspecified: Secondary | ICD-10-CM | POA: Diagnosis not present

## 2019-11-02 NOTE — Progress Notes (Signed)
HPI-   female never smoker followed for OSA complicated by asthma/COPD (Dr. Marchelle Gearing), allergic rhinitis (Dr. [place] Callas), gout NPSG 07/21/17-AHI 15.2/hour, desaturation to 84%, body weight 210 pounds PFT 08/08/2017-mild obstruction with significant response to bronchodilator, mild diffusion reduction.  FVC 1.85/72%, FEV1 1.39/73%, ratio 0.75, TLC 86%, DLCO 72%  -----------------------------------------------------------------------------------------  10/23/2018- 74 year old female never smoker followed for OSA complicated by asthma/COPD (Dr. Marchelle Gearing), allergic rhinitis (Dr. [place] Callas), gout CPAP auto 5-20/ [facility]    Night Guard Mouthpiece for bruxism Body weight today 195 lbs Download compliance 100%, AHI 2/ hr -----OSA on CPAP, DME: [facility]; no complaints Symbicort 160, Spiriva, theophyline 400, neb Duoneb, Flonase, zyrtec, albuterol hfa Wants to defer Flu vax today- plans to get it elsewhere Reviewed download. She is satisfied to continue CPAP. PCP checks her theophylline level as she usually takes 800 mg daily and says it helps. Rare need for rescue inhaler. Asks refills.  11/02/19- 74 year old female never smoker followed for OSA  asthma/COPD , complicated  rhinitis (Dr.  Callas), gout CPAP auto 5-20/ [facility]    Night Guard Mouthpiece for bruxism Download- compliance 100%, AHI  1.3/ hr Body weight today- 166 lbs Covid vax- 3 Phizer Flu vax- ------1 yr f/u - Feeling good  Using full face mask. Wears night guard for bruxism. She is satisfied with status. [facility] so she has record for travel.  Some ongoing postnasal drip. Discussed Nasalcrom as an option .   ROS-see HPI   + = positive Constitutional:    weight loss, night sweats, fevers, chills, fatigue, lassitude. HEENT:    headaches, difficulty swallowing, tooth/dental problems, sore throat,       sneezing, itching, ear ache, nasal congestion, post nasal drip, snoring CV:    chest pain, orthopnea, PND, swelling in  lower extremities, anasarca,                                                      dizziness, palpitations Resp:   shortness of breath with exertion or at rest.                productive cough,   non-productive cough, coughing up of blood.              change in color of mucus.  wheezing.   Skin:    rash or lesions. GI:  No-   heartburn, indigestion, abdominal pain, nausea, vomiting, diarrhea,                 change in bowel habits, loss of appetite GU: dysuria, change in color of urine, no urgency or frequency.   flank pain. MS:   joint pain, stiffness, decreased range of motion, back pain. Neuro-     nothing unusual Psych:  change in mood or affect.  depression or anxiety.   memory loss.  OBJ- Physical Exam General- Alert, Oriented, Affect-appropriate, Distress- none acute, + obese Skin- rash-none, lesions- none, excoriation- none Lymphadenopathy- none Head- atraumatic            Eyes- Gross vision intact, PERRLA, conjunctivae and secretions clear            Ears-+ bilateral hearing aids            Nose- Clear, no-Septal dev, mucus, polyps, erosion, perforation  Throat- Mallampati IV , mucosa clear , drainage- none, tonsils- atrophic Neck- flexible , trachea midline, no stridor , thyroid nl, carotid no bruit Chest - symmetrical excursion , unlabored           Heart/CV- RRR , no murmur , no gallop  , no rub, nl s1 s2                           - JVD- none , edema- none, stasis changes- none, varices- none           Lung- clear to P&A, wheeze- none, cough- none , dullness-none, rub- none           Chest wall-  Abd-  Br/ Gen/ Rectal- Not done, not indicated Extrem- cyanosis- none, clubbing, none, atrophy- none, strength- nl Neuro- grossly intact to observation

## 2019-11-02 NOTE — Patient Instructions (Signed)
We can continue CPAP auto 5-20,   Try using your Symbicort before breakfast and supper so chewing and swallowing help clear your mouth.  Consider trying otc nasal spray Nasalcrom/ cromolyn- follow directions on box as an alternative to Flonase. See if it helps the sneezing.  Try inhaling Symbicort through your mouth and exhaling it through your nose for a while to see if that helps your nose.  Please call if we can help

## 2019-11-07 ENCOUNTER — Encounter: Payer: Self-pay | Admitting: Internal Medicine

## 2019-11-07 DIAGNOSIS — J310 Chronic rhinitis: Secondary | ICD-10-CM | POA: Insufficient documentation

## 2019-11-07 NOTE — Assessment & Plan Note (Signed)
Benefits with good compliance and cocnytrol Plan- continue CPAP auto 5-20

## 2019-11-07 NOTE — Assessment & Plan Note (Signed)
No exacerbation and no recent concerns Plan- continue present meds as discussed. [facility]

## 2019-11-07 NOTE — Assessment & Plan Note (Signed)
Non-specific. Discussed options prevously tried Ok to try otc Nasalcrom

## 2019-12-11 ENCOUNTER — Other Ambulatory Visit: Payer: Self-pay | Admitting: Internal Medicine

## 2019-12-13 NOTE — Telephone Encounter (Signed)
Pt is requesting refill  on  THEOPHYLLINE 400MG  ER TABLETS next ov 11/01/20

## 2019-12-13 NOTE — Telephone Encounter (Signed)
Theophyline refilled

## 2020-01-06 NOTE — Progress Notes (Signed)
Cardiology Office Note   Date:  01/07/2020   ID:  Diana [name], DOB [birth year], MRN [id number]  PCP:  Eartha Inch, MD    No chief complaint on file.  CAD  Wt Readings from Last 3 Encounters:  01/07/20 156 lb 6.4 oz (70.9 kg)  11/02/19 166 lb 9.6 oz (75.6 kg)  10/23/18 195 lb 6.4 oz (88.6 kg)       History of Present Illness: Diana [name] is a 74 y.o. female  Who had a LBBB and mild CAD in 2008.  She had a false positive cardiolyte at that time.   No cath since that time.    Wearing CPAP.  Not exercising.   No recent stress test.  In 2020, moved to [place] from [place].   Since the last visit, she has been trying to help her husband lose weight.  He is also my patient.   Denies : Chest pain. Dizziness. Leg edema. Nitroglycerin use. Orthopnea. Palpitations. Paroxysmal nocturnal dyspnea. Shortness of breath. Syncope.   Limited by asthma and COPD.  She has a lot of seasonal allergies.   She has lost 40 lbs in 14 months.  She avoids all snacking.  Some days she has just 2 meals a day.     Past Medical History:  Diagnosis Date  . Allergic rhinitis   . Asthma   . COPD (chronic obstructive pulmonary disease) (HCC)   . Gout   . Left bundle branch block   . Sleep apnea   . Umbilical hernia     Past Surgical History:  Procedure Laterality Date  . ADENOIDECTOMY    . BREAST LUMPECTOMY Right   . CARDIAC CATHETERIZATION    . CATARACT EXTRACTION, BILATERAL    . ROOT CANAL    . SKIN BIOPSY    . TONSILLECTOMY    . WISDOM TOOTH EXTRACTION       Current Outpatient Medications  Medication Sig Dispense Refill  . albuterol (PROVENTIL HFA;VENTOLIN HFA) 108 (90 Base) MCG/ACT inhaler Inhale 2 puffs into the lungs every 6 (six) hours as needed for wheezing or shortness of breath. 1 Inhaler 3  . allopurinol (ZYLOPRIM) 300 MG tablet Take 300 mg by mouth daily.    Marland Kitchen aspirin EC 81 MG tablet Take 81 mg by mouth daily.    . budesonide-formoterol (SYMBICORT) 160-4.5  MCG/ACT inhaler INHALE 2 PUFFS INTO THE LUNGS TWICE DAILY 30.6 g 11  . CALCIUM-MAGNESIUM PO Take 1 tablet by mouth daily.    . cetirizine (ZYRTEC) 10 MG tablet Take 10 mg by mouth daily.    . cholecalciferol (VITAMIN D) 1000 units tablet Take 1,000 Units by mouth daily.    . diazepam (VALIUM) 5 MG tablet Take 5 mg by mouth every 6 (six) hours as needed for anxiety.    . diphenhydrAMINE (BENADRYL) 25 mg capsule Take 25 mg by mouth every 6 (six) hours as needed for allergies.    Marland Kitchen EPINEPHrine 0.3 mg/0.3 mL IJ SOAJ injection Inject 0.3 mg into the muscle as needed (anaphylaxis).    . Estradiol 10 MCG TABS vaginal tablet Place vaginally.    . fluticasone (FLONASE) 50 MCG/ACT nasal spray SHAKE LIQUID AND USE 2 SPRAYS IN EACH NOSTRIL EVERY DAY AS NEEDED 16 g 5  . FOLIC ACID PO Take 1 tablet by mouth daily.    . furosemide (LASIX) 20 MG tablet Take 20 mg by mouth daily.    Marland Kitchen ipratropium-albuterol (DUONEB) 0.5-2.5 (3) MG/3ML SOLN Take 3 mLs by nebulization  every 6 (six) hours as needed (shortness of breath). 360 mL 3  . levothyroxine (SYNTHROID) 50 MCG tablet TAKE 1 TABLET BY MOUTH DAILY    . metFORMIN (GLUCOPHAGE) 500 MG tablet Take 500 mg by mouth 2 (two) times daily with a meal.    . montelukast (SINGULAIR) 10 MG tablet Take 1 tablet (10 mg total) by mouth at bedtime. 30 tablet 3  . Multiple Vitamins-Minerals (MULTIVITAMIN WITH MINERALS) tablet Take 1 tablet by mouth daily.    . niacin (NIASPAN) 500 MG CR tablet   1  . niacin 500 MG tablet Take 500 mg by mouth at bedtime.    . OMEGA-3 FATTY ACIDS PO Take 1 capsule by mouth daily.    . potassium chloride SA (K-DUR,KLOR-CON) 20 MEQ tablet Take 20 mEq by mouth daily.    . simvastatin (ZOCOR) 20 MG tablet TK 1 T PO HS  1  . SPIRIVA RESPIMAT 1.25 MCG/ACT AERS INHALE 2 PUFFS INTO THE LUNGS DAILY 12 g 11  . temazepam (RESTORIL) 30 MG capsule     . theophylline (UNIPHYL) 400 MG 24 hr tablet TAKE 2 TABLETS BY MOUTH EVERY DAY 180 tablet 4  . vitamin B-12  (CYANOCOBALAMIN) 1000 MCG tablet Take 1,000 mcg by mouth daily.    . vitamin E 400 UNIT capsule Take 400 Units by mouth daily.     No current facility-administered medications for this visit.    Allergies:   Molds & smuts, Other, and Pollen extract    Social History:  The patient  reports that she has never smoked. She has never used smokeless tobacco. She reports that she does not drink alcohol and does not use drugs.   Family History:  The patient's family history includes Heart disease in her mother; Stroke in her brother.    ROS:  Please see the history of present illness.   Otherwise, review of systems are positive for seasonal allergies.   All other systems are reviewed and negative.    PHYSICAL EXAM: VS:  BP (!) 104/50   Pulse 83   Ht 5\' 1"  (1.549 m)   Wt 156 lb 6.4 oz (70.9 kg)   SpO2 96%   BMI 29.55 kg/m  , BMI Body mass index is 29.55 kg/m. GEN: Well nourished, well developed, in no acute distress  HEENT: normal  Neck: no JVD, carotid bruits, or masses Cardiac: RRR; no murmurs, rubs, or gallops,no edema  Respiratory:  clear to auscultation bilaterally, normal work of breathing GI: soft, nontender, nondistended, + BS MS: no deformity or atrophy  Skin: warm and dry, no rash Neuro:  Strength and sensation are intact Psych: euthymic mood, full affect   EKG:   The ekg ordered today demonstrates NSR, LBBB   Recent Labs: No results found for requested labs within last 8760 hours.   Lipid Panel No results found for: CHOL, TRIG, HDL, CHOLHDL, VLDL, LDLCALC, LDLDIRECT   Other studies Reviewed: Additional studies/ records that were reviewed today with results demonstrating: Labs reviewed.   ASSESSMENT AND PLAN:  1. CAD: No angina. Continue preventive therapy. RF modification.  She needs more exercise.  Whole food plant based diet.   2. LBBB: Chronic.  3. Hyperlipidemia: LDL 64, HDL 63, TG in 5/21.  4. PreDM- A1C 6.5 in 5/21.   On metformin.  Continue with  portion control and weight loss.  Continue to avoid processed foods.     Current medicines are reviewed at length with the patient today.  The patient concerns  regarding her medicines were addressed.  The following changes have been made:  No change  Labs/ tests ordered today include:  No orders of the defined types were placed in this encounter.   Recommend 150 minutes/week of aerobic exercise Low fat, low carb, high fiber diet recommended  Disposition:   FU in 1 year   Signed, Lance Muss, MD  01/07/2020 3:05 PM    [facility] [address] [place], [place], [place]  [address] Phone: [phone]; Fax: [phone]

## 2020-01-07 ENCOUNTER — Ambulatory Visit (INDEPENDENT_AMBULATORY_CARE_PROVIDER_SITE_OTHER): Payer: Medicare Other | Admitting: Interventional Cardiology

## 2020-01-07 ENCOUNTER — Other Ambulatory Visit: Payer: Self-pay

## 2020-01-07 ENCOUNTER — Encounter: Payer: Self-pay | Admitting: Interventional Cardiology

## 2020-01-07 VITALS — BP 104/50 | HR 83 | Ht 61.0 in | Wt 156.4 lb

## 2020-01-07 DIAGNOSIS — I447 Left bundle-branch block, unspecified: Secondary | ICD-10-CM

## 2020-01-07 DIAGNOSIS — E782 Mixed hyperlipidemia: Secondary | ICD-10-CM

## 2020-01-07 DIAGNOSIS — I2510 Atherosclerotic heart disease of native coronary artery without angina pectoris: Secondary | ICD-10-CM

## 2020-01-07 NOTE — Patient Instructions (Signed)
Medication Instructions:  Your physician recommends that you continue on your current medications as directed. Please refer to the Current Medication list given to you today.  *If you need a refill on your cardiac medications before your next appointment, please call your pharmacy*   Lab Work: none If you have labs (blood work) drawn today and your tests are completely normal, you will receive your results only by: . MyChart Message (if you have MyChart) OR . A paper copy in the mail If you have any lab test that is abnormal or we need to change your treatment, we will call you to review the results.   Testing/Procedures: none   Follow-Up: At [facility], you and your health needs are our priority.  As part of our continuing mission to provide you with exceptional heart care, we have created designated Provider Care Teams.  These Care Teams include your primary Cardiologist (physician) and Advanced Practice Providers (APPs -  Physician Assistants and Nurse Practitioners) who all work together to provide you with the care you need, when you need it.  We recommend signing up for the patient portal called "MyChart".  Sign up information is provided on this After Visit Summary.  MyChart is used to connect with patients for Virtual Visits (Telemedicine).  Patients are able to view lab/test results, encounter notes, upcoming appointments, etc.  Non-urgent messages can be sent to your provider as well.   To learn more about what you can do with MyChart, go to [email or web address].    Your next appointment:   One year(s)  The format for your next appointment:   In Person  Provider:   You may see Jayadeep Varanasi, MD or one of the following Advanced Practice Providers on your designated Care Team:    Dayna Dunn, PA-C  Michele Lenze, PA-C    Other Instructions   High-Fiber Diet Fiber, also called dietary fiber, is a type of carbohydrate that is found in fruits, vegetables,  whole grains, and beans. A high-fiber diet can have many health benefits. Your health care provider may recommend a high-fiber diet to help:  Prevent constipation. Fiber can make your bowel movements more regular.  Lower your cholesterol.  Relieve the following conditions: ? Swelling of veins in the anus (hemorrhoids). ? Swelling and irritation (inflammation) of specific areas of the digestive tract (uncomplicated diverticulosis). ? A problem of the large intestine (colon) that sometimes causes pain and diarrhea (irritable bowel syndrome, IBS).  Prevent overeating as part of a weight-loss plan.  Prevent heart disease, type 2 diabetes, and certain cancers. What is my plan? The recommended daily fiber intake in grams (g) includes:  38 g for men age 50 or younger.  30 g for men over age 50.  25 g for women age 50 or younger.  21 g for women over age 50. You can get the recommended daily intake of dietary fiber by:  Eating a variety of fruits, vegetables, grains, and beans.  Taking a fiber supplement, if it is not possible to get enough fiber through your diet. What do I need to know about a high-fiber diet?  It is better to get fiber through food sources rather than from fiber supplements. There is not a lot of research about how effective supplements are.  Always check the fiber content on the nutrition facts label of any prepackaged food. Look for foods that contain 5 g of fiber or more per serving.  Talk with a diet and nutrition specialist (dietitian) if   you have questions about specific foods that are recommended or not recommended for your medical condition, especially if those foods are not listed below.  Gradually increase how much fiber you consume. If you increase your intake of dietary fiber too quickly, you may have bloating, cramping, or gas.  Drink plenty of water. Water helps you to digest fiber. What are tips for following this plan?  Eat a wide variety of  high-fiber foods.  Make sure that half of the grains that you eat each day are whole grains.  Eat breads and cereals that are made with whole-grain flour instead of refined flour or white flour.  Eat brown rice, bulgur wheat, or millet instead of white rice.  Start the day with a breakfast that is high in fiber, such as a cereal that contains 5 g of fiber or more per serving.  Use beans in place of meat in soups, salads, and pasta dishes.  Eat high-fiber snacks, such as berries, raw vegetables, nuts, and popcorn.  Choose whole fruits and vegetables instead of processed forms like juice or sauce. What foods can I eat?  Fruits Berries. Pears. Apples. Oranges. Avocado. Prunes and raisins. Dried figs. Vegetables Sweet potatoes. Spinach. Kale. Artichokes. Cabbage. Broccoli. Cauliflower. Green peas. Carrots. Squash. Grains Whole-grain breads. Multigrain cereal. Oats and oatmeal. Brown rice. Barley. Bulgur wheat. Millet. Quinoa. Bran muffins. Popcorn. Rye wafer crackers. Meats and other proteins Navy, kidney, and pinto beans. Soybeans. Split peas. Lentils. Nuts and seeds. Dairy Fiber-fortified yogurt. Beverages Fiber-fortified soy milk. Fiber-fortified orange juice. Other foods Fiber bars. The items listed above may not be a complete list of recommended foods and beverages. Contact a dietitian for more options. What foods are not recommended? Fruits Fruit juice. Cooked, strained fruit. Vegetables Fried potatoes. Canned vegetables. Well-cooked vegetables. Grains White bread. Pasta made with refined flour. White rice. Meats and other proteins Fatty cuts of meat. Fried chicken or fried fish. Dairy Milk. Yogurt. Cream cheese. Sour cream. Fats and oils Butters. Beverages Soft drinks. Other foods Cakes and pastries. The items listed above may not be a complete list of foods and beverages to avoid. Contact a dietitian for more information. Summary  Fiber is a type of  carbohydrate. It is found in fruits, vegetables, whole grains, and beans.  There are many health benefits of eating a high-fiber diet, such as preventing constipation, lowering blood cholesterol, helping with weight loss, and reducing your risk of heart disease, diabetes, and certain cancers.  Gradually increase your intake of fiber. Increasing too fast can result in cramping, bloating, and gas. Drink plenty of water while you increase your fiber.  The best sources of fiber include whole fruits and vegetables, whole grains, nuts, seeds, and beans. This information is not intended to replace advice given to you by your health care provider. Make sure you discuss any questions you have with your health care provider. Document Revised: 11/11/2016 Document Reviewed: 11/11/2016 Elsevier Patient Education  2020 Elsevier Inc.   

## 2020-02-18 ENCOUNTER — Telehealth: Payer: Self-pay | Admitting: Internal Medicine

## 2020-02-18 MED ORDER — ALBUTEROL SULFATE HFA 108 (90 BASE) MCG/ACT IN AERS: 2.0000 | INHALATION_SPRAY | Freq: Four times a day (QID) | RESPIRATORY_TRACT | 3 refills | Status: DC | PRN

## 2020-02-18 NOTE — Telephone Encounter (Signed)
02/18/20  Patient contacted our office requesting refill of albuterol rescue inhaler.  She reports that she has not needed to use her rescue inhaler very often.  She reports adherence to maintenance inhalers.  Dr. Maple Hudson please advise if you are agreeable with the patient to have a refill of her rescue inhaler.  She was last seen by Dr. Maple Hudson in October/2021 it was requested then that she have a follow-up in 1 year  [name], FNP

## 2020-02-18 NOTE — Telephone Encounter (Signed)
Please refer her rescue inhaler for 12 months -Thanks

## 2020-02-18 NOTE — Telephone Encounter (Signed)
rx sent to preferred pharmacy.  Nothing further needed at this time- will close encounter.   

## 2020-04-28 ENCOUNTER — Other Ambulatory Visit: Payer: Self-pay | Admitting: Internal Medicine

## 2020-08-17 ENCOUNTER — Other Ambulatory Visit: Payer: Self-pay | Admitting: Internal Medicine

## 2020-11-01 ENCOUNTER — Ambulatory Visit: Payer: Medicare Other | Admitting: Internal Medicine

## 2020-11-07 NOTE — Progress Notes (Signed)
HPI-   female never smoker followed for OSA complicated by asthma/COPD (Dr. Marchelle Gearing), allergic rhinitis (Dr. [place] Callas), gout NPSG 07/21/17-AHI 15.2/hour, desaturation to 84%, body weight 210 pounds PFT 08/08/2017-mild obstruction with significant response to bronchodilator, mild diffusion reduction.  FVC 1.85/72%, FEV1 1.39/73%, ratio 0.75, TLC 86%, DLCO 72%  -----------------------------------------------------------------------------------------   11/02/19- 75 year old female never smoker followed for OSA  asthma/COPD , complicated  rhinitis (Dr. [place] Callas), gout CPAP auto 5-20/ [facility]    Night Guard Mouthpiece for bruxism Download- compliance 100%, AHI  1.3/ hr Body weight today- 166 lbs Covid vax- 3 Phizer Flu vax- ------1 yr f/u - Feeling good  Using full face mask. Wears night guard for bruxism. She is satisfied with status. Getting flu sot at health Dept so she has record for travel.  Some ongoing postnasal drip. Discussed Nasalcrom as an option .  11/08/20- 75 year old female never smoker followed for OSA  asthma/COPD , complicated  rhinitis (Dr. [place] Callas), gout CPAP auto 5-20/ [facility]    Night Guard Mouthpiece for bruxism -Symbicort 160, Flonase, Uniphyl 400, Singulair, Spiriva 1.25, Ventolin hfa, neb Duoneb,  Download-compliance 93%, AHI 1.3/hr Body weight today-141 lbs Covid vax-4 Phizer Flu vax-gets from health department -----Wearing CPAP doing good Recently PCP treated sinus infection with abx.Some residual cough w/o SOB. Dry mouth with nasal mask.  Dislikes Biotene mouth rinse.   Nasalcrom helps.  She does not think any changes are needed.  Stopped temazepam.  Uses antihistamine based OTC sleep medicines which seem to work well enough.  ROS-see HPI   + = positive Constitutional:    weight loss, night sweats, fevers, chills, fatigue, lassitude. HEENT:    headaches, difficulty swallowing, tooth/dental problems, sore throat,       sneezing, itching, ear ache, nasal congestion,  post nasal drip, snoring CV:    chest pain, orthopnea, PND, swelling in lower extremities, anasarca,                                                      dizziness, palpitations Resp:   shortness of breath with exertion or at rest.                productive cough,   non-productive cough, coughing up of blood.              change in color of mucus.  wheezing.   Skin:    rash or lesions. GI:  No-   heartburn, indigestion, abdominal pain, nausea, vomiting, diarrhea,                 change in bowel habits, loss of appetite GU: dysuria, change in color of urine, no urgency or frequency.   flank pain. MS:   joint pain, stiffness, decreased range of motion, back pain. Neuro-     nothing unusual Psych:  change in mood or affect.  depression or anxiety.   memory loss.  OBJ- Physical Exam General- Alert, Oriented, Affect-appropriate, Distress- none acute, + obese Skin- rash-none, lesions- none, excoriation- none Lymphadenopathy- none Head- atraumatic            Eyes- Gross vision intact, PERRLA, conjunctivae and secretions clear            Ears-+ bilateral hearing aids            Nose- Clear, no-Septal dev, mucus, polyps, erosion,  perforation             Throat- Mallampati IV , mucosa clear , drainage- none, tonsils- atrophic Neck- flexible , trachea midline, no stridor , thyroid nl, carotid no bruit Chest - symmetrical excursion , unlabored           Heart/CV- RRR , no murmur , no gallop  , no rub, nl s1 s2                           - JVD- none , edema- none, stasis changes- none, varices- none           Lung- clear to P&A, wheeze- none, cough- none , dullness-none, rub- none           Chest wall-  Abd-  Br/ Gen/ Rectal- Not done, not indicated Extrem- cyanosis- none, clubbing, none, atrophy- none, strength- nl Neuro- grossly intact to observation

## 2020-11-08 ENCOUNTER — Encounter: Payer: Self-pay | Admitting: Internal Medicine

## 2020-11-08 ENCOUNTER — Ambulatory Visit (INDEPENDENT_AMBULATORY_CARE_PROVIDER_SITE_OTHER): Payer: Medicare Other | Admitting: Internal Medicine

## 2020-11-08 ENCOUNTER — Other Ambulatory Visit: Payer: Self-pay

## 2020-11-08 DIAGNOSIS — J449 Chronic obstructive pulmonary disease, unspecified: Secondary | ICD-10-CM | POA: Diagnosis not present

## 2020-11-08 DIAGNOSIS — G4733 Obstructive sleep apnea (adult) (pediatric): Secondary | ICD-10-CM | POA: Diagnosis not present

## 2020-11-08 NOTE — Patient Instructions (Signed)
We can continue CPAP auto 5-20  You can adjust the humidifier as needed to balance condensation in the hose against dry mouth.  We can continue current breathing meds  Please call if we can help

## 2021-02-08 ENCOUNTER — Other Ambulatory Visit: Payer: Self-pay | Admitting: Internal Medicine

## 2021-02-13 NOTE — Telephone Encounter (Signed)
Theophylline refilled 

## 2021-02-26 ENCOUNTER — Encounter: Payer: Self-pay | Admitting: Internal Medicine

## 2021-02-26 ENCOUNTER — Other Ambulatory Visit: Payer: Self-pay | Admitting: Internal Medicine

## 2021-02-26 NOTE — Assessment & Plan Note (Signed)
Current meds have been adequate.  No breakthrough asthma even with recent sinus infection. Plan-continue current meds.

## 2021-02-26 NOTE — Assessment & Plan Note (Signed)
Benefits from CPAP with good compliance and control Plan- continue auto 5-20 

## 2021-06-07 ENCOUNTER — Other Ambulatory Visit: Payer: Self-pay | Admitting: Internal Medicine

## 2021-06-30 ENCOUNTER — Other Ambulatory Visit: Payer: Self-pay | Admitting: Internal Medicine

## 2021-07-02 ENCOUNTER — Other Ambulatory Visit: Payer: Self-pay | Admitting: Internal Medicine

## 2021-11-07 NOTE — Progress Notes (Unsigned)
HPI-   female never smoker followed for OSA complicated by asthma/COPD (Dr. Marchelle Gearing), allergic rhinitis (Dr. [place] Callas), gout NPSG 07/21/17-AHI 15.2/hour, desaturation to 84%, body weight 210 pounds PFT 08/08/2017-mild obstruction with significant response to bronchodilator, mild diffusion reduction.  FVC 1.85/72%, FEV1 1.39/73%, ratio 0.75, TLC 86%, DLCO 72%  -----------------------------------------------------------------------------------------   11/08/20- 76 year old female never smoker followed for OSA  asthma/COPD , complicated  rhinitis (Dr. [place] Callas), gout CPAP auto 5-20/ [facility]    Night Guard Mouthpiece for bruxism -Symbicort 160, Flonase, Uniphyl 400, Singulair, Spiriva 1.25, Ventolin hfa, neb Duoneb,  Download-compliance 93%, AHI 1.3/hr Body weight today-141 lbs Covid vax-4 Phizer Flu vax-gets from health department -----Wearing CPAP doing good Recently PCP treated sinus infection with abx.Some residual cough w/o SOB. Dry mouth with nasal mask.  Dislikes Biotene mouth rinse.   Nasalcrom helps.  She does not think any changes are needed.  Stopped temazepam.  Uses antihistamine based OTC sleep medicines which seem to work well enough.  11/08/21- 76 year old female never smoker followed for OSA  asthma/COPD , complicated  rhinitis (Dr. [place] Callas), gout, DM2,  CPAP auto 5-20/ [facility]    Night Guard Mouthpiece for bruxism -Symbicort 160, Flonase, Uniphyl 400, Singulair, Spiriva 1.25, Ventolin hfa, neb Duoneb, Temazepam,  ACT score- 24 Download-compliance 80%, AHI 1.5/ hr Body weight today-154 lbs Covid vax-4 Phizer Flu vax-gets from health department -----Pt is doing well. No complaints Here and she looks to him for answers of questions about how she is feeling.  They indicate she is now used her CPAP.  Sometimes takes it off during the night but basically doing well.  Download reviewed. Pending RFA for coccyx pain. Chronic rhinitis for which she indicates she has a lot of nasal sprays.   I made sure she was not overusing nasal decongestants.  She is followed by her allergist and is on allergy vaccine.   ROS-see HPI   + = positive Constitutional:    weight loss, night sweats, fevers, chills, fatigue, lassitude. HEENT:    headaches, difficulty swallowing, tooth/dental problems, sore throat,       sneezing, itching, ear ache, nasal congestion, post nasal drip, snoring CV:    chest pain, orthopnea, PND, swelling in lower extremities, anasarca,                                                      dizziness, palpitations Resp:   shortness of breath with exertion or at rest.                productive cough,   non-productive cough, coughing up of blood.              change in color of mucus.  wheezing.   Skin:    rash or lesions. GI:  No-   heartburn, indigestion, abdominal pain, nausea, vomiting, diarrhea,                 change in bowel habits, loss of appetite GU: dysuria, change in color of urine, no urgency or frequency.   flank pain. MS:   joint pain, stiffness, decreased range of motion, back pain. Neuro-     nothing unusual Psych:  change in mood or affect.  depression or anxiety.   memory loss.  OBJ- Physical Exam General- Alert, Oriented, Affect-appropriate, Distress- none acute, + obese Skin- rash-none, lesions-  none, excoriation- none Lymphadenopathy- none Head- atraumatic            Eyes- Gross vision intact, PERRLA, conjunctivae and secretions clear            Ears-+ bilateral hearing aids            Nose- Clear, no-Septal dev, mucus, polyps, erosion, perforation             Throat- Mallampati IV , mucosa clear , drainage- none, tonsils- atrophic Neck- flexible , trachea midline, no stridor , thyroid nl, carotid no bruit Chest - symmetrical excursion , unlabored           Heart/CV- RRR , no murmur , no gallop  , no rub, nl s1 s2                           - JVD- none , edema- none, stasis changes- none, varices- none           Lung- clear to P&A, wheeze- none,  cough- none , dullness-none, rub- none           Chest wall-  Abd-  Br/ Gen/ Rectal- Not done, not indicated Extrem- cyanosis- none, clubbing, none, atrophy- none, strength- nl Neuro- grossly intact to observation

## 2021-11-08 ENCOUNTER — Ambulatory Visit (INDEPENDENT_AMBULATORY_CARE_PROVIDER_SITE_OTHER): Payer: Medicare Other | Admitting: Internal Medicine

## 2021-11-08 ENCOUNTER — Encounter: Payer: Self-pay | Admitting: Internal Medicine

## 2021-11-08 DIAGNOSIS — J4530 Mild persistent asthma, uncomplicated: Secondary | ICD-10-CM

## 2021-11-08 DIAGNOSIS — J310 Chronic rhinitis: Secondary | ICD-10-CM

## 2021-11-08 DIAGNOSIS — G4733 Obstructive sleep apnea (adult) (pediatric): Secondary | ICD-10-CM | POA: Diagnosis not present

## 2021-11-08 NOTE — Assessment & Plan Note (Signed)
She works with her allergist and indicates no recent problems at all.

## 2021-11-08 NOTE — Assessment & Plan Note (Signed)
Continues allergy vaccine and indicates she has "a lot" of nasal sprays.  She tends to remain "stuffy" and I suggested she ask about seeing ENT if there is thought to be a mechanical blockage in her nasal airway.

## 2021-11-08 NOTE — Assessment & Plan Note (Signed)
Benefits from CPAP and meeting compliance and control goals.  We discussed comfort and routine use.  Nasal mask. Plan-continue auto 5-20

## 2021-11-08 NOTE — Patient Instructions (Signed)
We can continue CPAP auto 5-20  I hoe the RFA treatment helps   Please call if we can help

## 2022-01-16 ENCOUNTER — Other Ambulatory Visit: Payer: Self-pay | Admitting: Internal Medicine

## 2022-03-20 ENCOUNTER — Other Ambulatory Visit: Payer: Self-pay | Admitting: Internal Medicine

## 2022-04-13 ENCOUNTER — Other Ambulatory Visit: Payer: Self-pay | Admitting: Internal Medicine

## 2022-04-25 ENCOUNTER — Other Ambulatory Visit: Payer: Self-pay | Admitting: Internal Medicine

## 2022-04-29 NOTE — Telephone Encounter (Signed)
OK refill Symbicort 160 mcg x 6 refills.  Per chart note from 11/08/21:   Return in about 1 year (around 11/09/2022).

## 2022-05-22 ENCOUNTER — Telehealth: Payer: Self-pay | Admitting: Internal Medicine

## 2022-05-22 DIAGNOSIS — G4733 Obstructive sleep apnea (adult) (pediatric): Secondary | ICD-10-CM

## 2022-05-22 NOTE — Telephone Encounter (Signed)
Patient called to ask the doctor if she can discontinue her cpap and nebulizer machines.  She stated she does not feel she is getting anything out of them.  She has had them for over 10 years.  Please advise and call patient to discuss at [phone] or [phone]

## 2022-05-24 NOTE — Telephone Encounter (Signed)
PT calling back again after no call back. Pls call.

## 2022-05-27 NOTE — Telephone Encounter (Signed)
Spoke with patient. Advised order has been placed to D/C cpap machine. Advised patinet since she owns neb [employer]ien to just keep incase she ever needs it. She wants to keep upcoming apt with Dr. Maple Hudson in October. NFN

## 2022-05-27 NOTE — Telephone Encounter (Signed)
Spoke with patient. She states she doesn't feel like she doesn't need cpap machine or nebulizer. States she has used these over the last 10 years and doesn't think she has really benefited from either. Dr. Maple Hudson please advise if your okay with me D/C both cpap and nebulizer. She doesn't really use cpap machine-sometimes she forgets to put it on. Hasn't used nebulizer in 6 months or more

## 2022-05-27 NOTE — Telephone Encounter (Signed)
She probably owns nebulizer. If so, I would keep it.  Ok to dc CPAP  We can see her again as needed

## 2022-06-04 NOTE — Progress Notes (Signed)
[facility] - Clinic Note  06/10/2022   CHIEF COMPLAINT Patient presents for Retina Evaluation  HISTORY OF PRESENT ILLNESS: Diana [name] is a 77 y.o. female who presents to the clinic today for:  HPI     Retina Evaluation   In both eyes.  This started 1 week ago.  Duration of 1 week.  Context:  distance vision, mid-range vision and near vision.  I, the attending physician,  performed the HPI with the patient and updated documentation appropriately.        Comments   Ret eval per Dr Jimmey Ralph for ERM ou mac hole and partial thickness pt is reporting she is having blurred vision she denies any flashes or floaters she is not currently checking her blood sugar at this time       Last edited by Rennis Chris, MD on 06/10/2022 10:02 PM.    Pt is here on the referral of Dr. Jimmey Ralph for concern of ERM, pt states she has monovision, she doesn't know which eye is near, pt is taking brimonidine BID OU and latanoprost QHS OU   Referring physician: Mat Carne, DO [address] [place],  [place] [address]  HISTORICAL INFORMATION:  Selected notes from the MEDICAL RECORD NUMBER Referred by Dr. Jimmey Ralph for ERM w/ cystic changes OU LEE:  Ocular Hx- PMH-   CURRENT MEDICATIONS: No current outpatient medications on file. (Ophthalmic Drugs)   No current facility-administered medications for this visit. (Ophthalmic Drugs)   Current Outpatient Medications (Other)  Medication Sig   albuterol (VENTOLIN HFA) 108 (90 Base) MCG/ACT inhaler INHALE 2 PUFFS INTO THE LUNGS EVERY 6 HOURS AS NEEDED FOR WHEEZING OR SHORTNESS OF BREATH   allopurinol (ZYLOPRIM) 300 MG tablet Take 300 mg by mouth daily.   aspirin EC 81 MG tablet Take 81 mg by mouth daily.   budesonide-formoterol (SYMBICORT) 160-4.5 MCG/ACT inhaler INHALE 2 PUFFS INTO THE LUNGS TWICE DAILY   CALCIUM-MAGNESIUM PO Take 1 tablet by mouth daily.   cetirizine (ZYRTEC) 10 MG tablet Take 10 mg by mouth daily.   cholecalciferol  (VITAMIN D) 1000 units tablet Take 1,000 Units by mouth daily.   diazepam (VALIUM) 5 MG tablet Take 5 mg by mouth every 6 (six) hours as needed for anxiety.   diphenhydrAMINE (BENADRYL) 25 mg capsule Take 25 mg by mouth every 6 (six) hours as needed for allergies.   EPINEPHrine 0.3 mg/0.3 mL IJ SOAJ injection Inject 0.3 mg into the muscle as needed (anaphylaxis).   Estradiol 10 MCG TABS vaginal tablet Place vaginally.   fluticasone (FLONASE) 50 MCG/ACT nasal spray SHAKE LIQUID AND USE 2 SPRAYS IN EACH NOSTRIL EVERY DAY AS NEEDED   FOLIC ACID PO Take 1 tablet by mouth daily.   furosemide (LASIX) 20 MG tablet Take 20 mg by mouth daily.   ipratropium-albuterol (DUONEB) 0.5-2.5 (3) MG/3ML SOLN Take 3 mLs by nebulization every 6 (six) hours as needed (shortness of breath).   levothyroxine (SYNTHROID) 50 MCG tablet TAKE 1 TABLET BY MOUTH DAILY   metFORMIN (GLUCOPHAGE) 500 MG tablet Take 500 mg by mouth 2 (two) times daily with a meal.   montelukast (SINGULAIR) 10 MG tablet Take 1 tablet (10 mg total) by mouth at bedtime.   Multiple Vitamins-Minerals (MULTIVITAMIN WITH MINERALS) tablet Take 1 tablet by mouth daily.   niacin (NIASPAN) 500 MG CR tablet 500 mg at bedtime.   niacin 500 MG tablet Take 500 mg by mouth at bedtime.   OMEGA-3 FATTY ACIDS PO  Take 1 capsule by mouth daily.   potassium chloride SA (K-DUR,KLOR-CON) 20 MEQ tablet Take 20 mEq by mouth daily.   simvastatin (ZOCOR) 20 MG tablet TK 1 T PO HS   temazepam (RESTORIL) 30 MG capsule 30 mg at bedtime as needed.   theophylline (UNIPHYL) 400 MG 24 hr tablet TAKE 2 TABLETS BY MOUTH EVERY DAY   thiamine (VITAMIN B-1) 50 MG tablet Take 50 mg by mouth daily.   Tiotropium Bromide Monohydrate (SPIRIVA RESPIMAT) 1.25 MCG/ACT AERS INHALE 2 PUFFS INTO THE LUNGS DAILY   vitamin B-12 (CYANOCOBALAMIN) 1000 MCG tablet Take 1,000 mcg by mouth daily.   vitamin E 400 UNIT capsule Take 400 Units by mouth daily.   No current facility-administered medications  for this visit. (Other)   REVIEW OF SYSTEMS: ROS   Positive for: Endocrine, Eyes Last edited by Etheleen Mayhew, COT on 06/10/2022  1:07 PM.     ALLERGIES Allergies  Allergen Reactions   Molds & Smuts Shortness Of Breath   Other Shortness Of Breath    CAT   Pollen Extract Shortness Of Breath   PAST MEDICAL HISTORY Past Medical History:  Diagnosis Date   Allergic rhinitis    Asthma    COPD (chronic obstructive pulmonary disease) (HCC)    Gout    Left bundle branch block    Sleep apnea    Umbilical hernia    Past Surgical History:  Procedure Laterality Date   ADENOIDECTOMY     BREAST LUMPECTOMY Right    CARDIAC CATHETERIZATION     CATARACT EXTRACTION, BILATERAL     ROOT CANAL     SKIN BIOPSY     TONSILLECTOMY     WISDOM TOOTH EXTRACTION     FAMILY HISTORY Family History  Problem Relation Age of Onset   Heart disease Mother    Stroke Brother    SOCIAL HISTORY Social History   Tobacco Use   Smoking status: Never   Smokeless tobacco: Never  Substance Use Topics   Alcohol use: No   Drug use: No       OPHTHALMIC EXAM:  Base Eye Exam     Visual Acuity (Snellen - Linear)       Right Left   Dist [place] 20/25 20/50   Dist ph [place] 20/20 -2 20/40 -2  Mono va os near         Tonometry (Tonopen, 1:15 PM)       Right Left   Pressure 6 8         Pupils       Pupils Dark Light Shape React APD   Right PERRL 3 2 Round Brisk None   Left PERRL 3 2 Round Brisk None         Visual Fields       Left Right    Full Full         Extraocular Movement       Right Left    Full, Ortho Full, Ortho         Neuro/Psych     Oriented x3: Yes   Mood/Affect: Normal         Dilation     Both eyes: 2.5% Phenylephrine @ 1:15 PM           Slit Lamp and Fundus Exam     Slit Lamp Exam       Right Left   Lids/Lashes Dermatochalasis - upper lid, Meibomian gland dysfunction, mild Telangiectasia Dermatochalasis - upper lid,  Meibomian gland  dysfunction, mild Telangiectasia   Conjunctiva/Sclera White and quiet White and quiet   Cornea 1-2+ inferior Punctate epithelial erosions 1-2+ inferior Punctate epithelial erosions   Anterior Chamber deep and clear deep and clear   Iris Round and dilated Round and dilated   Lens PC IOL in good position Toric PC IOL in good position with marks at 0730 and 0130   Anterior Vitreous mild syneresis, Posterior vitreous detachment mild syneresis, Posterior vitreous detachment         Fundus Exam       Right Left   Disc mild Pallor, Sharp rim Pink and Sharp   C/D Ratio 0.6 0.6   Macula Flat, Blunted foveal reflex, RPE mottling, mild ERM with central cystic changs, No heme or edema Flat, Blunted foveal reflex, drusen, RPE mottling, mild ERM with trace cystic changs, No heme or edema   Vessels attenuated, mild tortuosity attenuated, mild tortuosity   Periphery Attached, reticular degeneration, No heme Attached, reticular degeneration, No heme           Refraction     Manifest Refraction       Sphere Cylinder Dist VA   Right      Left -0.50 Sphere 20/40           IMAGING AND PROCEDURES  Imaging and Procedures for 06/10/2022  OCT, Retina - OU - Both Eyes       Right Eye Quality was good. Central Foveal Thickness: 333. Progression has no prior data. Findings include normal foveal contour, no SRF, retinal drusen , epiretinal membrane, intraretinal fluid, outer retinal atrophy, vitreomacular adhesion (Mild ERM with central cystic changes and central ellipsoid disruption).   Left Eye Quality was good. Central Foveal Thickness: 341. Progression has no prior data. Findings include normal foveal contour, no SRF, retinal drusen , intraretinal fluid, vitreomacular adhesion (ERM with central cystic changes).   Notes *Images captured and stored on drive  Diagnosis / Impression:  OD: Mild ERM with central cystic changes and central ellipsoid disruption OS: ERM with central cystic  changes  Clinical management:  See below  Abbreviations: NFP - Normal foveal profile. CME - cystoid macular edema. PED - pigment epithelial detachment. IRF - intraretinal fluid. SRF - subretinal fluid. EZ - ellipsoid zone. ERM - epiretinal membrane. ORA - outer retinal atrophy. ORT - outer retinal tubulation. SRHM - subretinal hyper-reflective material. IRHM - intraretinal hyper-reflective material      Fluorescein Angiography Optos (Transit OS)       Right Eye Progression has no prior data. Early phase findings include staining. Mid/Late phase findings include staining (Mild peripapillary staining, no leakage, no central leakage or staining).   Left Eye Progression has no prior data. Early phase findings include staining. Mid/Late phase findings include staining (Mild peripapillary staining, no leakage, no central leakage or staining).   Notes **Images stored on drive**  Impression: Mild peripapillary staining, no leakage, no central leakage or staining OU           ASSESSMENT/PLAN:   ICD-10-CM   1. Epiretinal membrane (ERM) of both eyes  H35.373 OCT, Retina - OU - Both Eyes    2. Intermediate stage nonexudative age-related macular degeneration of both eyes  H35.3132     3. Diabetes mellitus type 2 without retinopathy (HCC)  E11.9 OCT, Retina - OU - Both Eyes    Fluorescein Angiography Optos (Transit OS)    4. Long term (current) use of oral hypoglycemic drugs  Z79.84  5. Pseudophakia of both eyes  Z96.1      Epiretinal membrane, both eyes  - The natural history, anatomy, potential for loss of vision, and treatment options including vitrectomy techniques and the complications of endophthalmitis, retinal detachment, vitreous hemorrhage, cataract progression and permanent vision loss discussed with the patient. - mild ERM w/ central cystic changes OU; OD with focal ellipsoid disruption centrally - BCVA OD: 20/20, OS: 20/40 - OCT shows central cystic changes OU - FA  (05.20.24) shows no central leakage or staining within central cysts - asymptomatic, no metamorphopsia - no indication for surgery at this time - monitor for now - f/u 4-6 mos -- DFE/OCT  2. Age related macular degeneration, non-exudative, both eyes  - intermediate stage  - The incidence, anatomy, and pathology of dry AMD, risk of progression, and the AREDS and AREDS 2 study including smoking risks discussed with patient.  - Recommend amsler grid monitoring  - monitor  3,4. 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   - The need for close monitoring of blood glucose, blood pressure, and serum lipids, avoiding cigarette or any type of tobacco, and the need for long term follow up was also discussed with patient. - f/u in 1 year, sooner prn  5. Pseudophakia OU  - s/p CE/IOL OU  - IOL in good position, doing well  - monitor  Ophthalmic Meds Ordered this visit:  No orders of the defined types were placed in this encounter.    Return for f/u 4-6 months, ERM OU, DFE, OCT.  There are no Patient Instructions on file for this visit.  Explained the diagnoses, plan, and follow up with the patient and they expressed understanding.  Patient expressed understanding of the importance of proper follow up care.   This document serves as a record of services personally performed by Karie Chimera, MD, PhD. It was created on their behalf by De Blanch, an ophthalmic technician. The creation of this record is the provider's dictation and/or activities during the visit.    Electronically signed by: De Blanch, OA, 06/10/22  10:04 PM  This document serves as a record of services personally performed by Karie Chimera, MD, PhD. It was created on their behalf by Glee Arvin. Manson Passey, OA an ophthalmic technician. The creation of this record is the  provider's dictation and/or activities during the visit.    Electronically signed by: Glee Arvin. Manson Passey, [place] 05.20.2024 10:04 PM  Karie Chimera, M.D., Ph.D. Diseases & Surgery of the Retina and Vitreous [facility] 06/10/2022  I have reviewed the above documentation for accuracy and completeness, and I agree with the above. Karie Chimera, M.D., Ph.D. 06/10/22 10:06 PM  Abbreviations: M myopia (nearsighted); A astigmatism; H hyperopia (farsighted); P presbyopia; Mrx spectacle prescription;  CTL contact lenses; OD right eye; OS left eye; OU both eyes  XT exotropia; ET esotropia; PEK punctate epithelial keratitis; PEE punctate epithelial erosions; DES dry eye syndrome; MGD meibomian gland dysfunction; ATs artificial tears; PFAT's preservative free artificial tears; NSC nuclear sclerotic cataract; PSC posterior subcapsular cataract; ERM epi-retinal membrane; PVD posterior vitreous detachment; RD retinal detachment; DM diabetes mellitus; DR diabetic retinopathy; NPDR non-proliferative diabetic retinopathy; PDR proliferative diabetic retinopathy; CSME clinically significant macular edema; DME diabetic macular edema; dbh dot blot hemorrhages; CWS cotton wool spot; POAG primary open angle glaucoma; C/D cup-to-disc ratio; HVF humphrey visual  field; GVF goldmann visual field; OCT optical coherence tomography; IOP intraocular pressure; BRVO Branch retinal vein occlusion; CRVO central retinal vein occlusion; CRAO central retinal artery occlusion; BRAO branch retinal artery occlusion; RT retinal tear; SB scleral buckle; PPV pars plana vitrectomy; VH Vitreous hemorrhage; PRP panretinal laser photocoagulation; IVK intravitreal kenalog; VMT vitreomacular traction; MH Macular hole;  NVD neovascularization of the disc; NVE neovascularization elsewhere; AREDS age related eye disease study; ARMD age related macular degeneration; POAG primary open angle glaucoma; EBMD epithelial/anterior basement  membrane dystrophy; ACIOL anterior chamber intraocular lens; IOL intraocular lens; PCIOL posterior chamber intraocular lens; Phaco/IOL phacoemulsification with intraocular lens placement; PRK photorefractive keratectomy; LASIK laser assisted in situ keratomileusis; HTN hypertension; DM diabetes mellitus; COPD chronic obstructive pulmonary disease

## 2022-06-10 ENCOUNTER — Encounter (INDEPENDENT_AMBULATORY_CARE_PROVIDER_SITE_OTHER): Payer: Self-pay | Admitting: Ophthalmology

## 2022-06-10 ENCOUNTER — Ambulatory Visit (INDEPENDENT_AMBULATORY_CARE_PROVIDER_SITE_OTHER): Payer: Medicare Other | Admitting: Ophthalmology

## 2022-06-10 DIAGNOSIS — Z7984 Long term (current) use of oral hypoglycemic drugs: Secondary | ICD-10-CM

## 2022-06-10 DIAGNOSIS — H353132 Nonexudative age-related macular degeneration, bilateral, intermediate dry stage: Secondary | ICD-10-CM

## 2022-06-10 DIAGNOSIS — H35373 Puckering of macula, bilateral: Secondary | ICD-10-CM | POA: Diagnosis not present

## 2022-06-10 DIAGNOSIS — Z961 Presence of intraocular lens: Secondary | ICD-10-CM

## 2022-06-10 DIAGNOSIS — H3581 Retinal edema: Secondary | ICD-10-CM

## 2022-06-10 DIAGNOSIS — E119 Type 2 diabetes mellitus without complications: Secondary | ICD-10-CM

## 2022-08-06 ENCOUNTER — Other Ambulatory Visit: Payer: Self-pay | Admitting: Internal Medicine

## 2022-08-15 ENCOUNTER — Other Ambulatory Visit: Payer: Self-pay | Admitting: Internal Medicine

## 2022-11-13 NOTE — Progress Notes (Signed)
HPI-   female never smoker followed for OSA complicated by asthma/COPD (Dr. Marchelle Gearing), allergic rhinitis (Dr. Wilmer Callas), gout NPSG 07/21/17-AHI 15.2/hour, desaturation to 84%, body weight 210 pounds PFT 08/08/2017-mild obstruction with significant response to bronchodilator, mild diffusion reduction.  FVC 1.85/72%, FEV1 1.39/73%, ratio 0.75, TLC 86%, DLCO 72%  -----------------------------------------------------------------------------------------   11/08/21- 77 year old female never smoker followed for OSA  asthma/COPD , complicated  rhinitis (Dr. Detmold Callas), gout, DM2,  CPAP auto 5-20/ [facility]    Night Guard Mouthpiece for bruxism -Symbicort 160, Flonase, Uniphyl 400, Singulair, Spiriva 1.25, Ventolin hfa, neb Duoneb, Temazepam,  ACT score- 24 Download-compliance 80%, AHI 1.5/ hr Body weight today-154 lbs Covid vax-4 [employer] Flu vax-gets from health department -----Pt is doing well. No complaints Husband here and she looks to him for answers of questions about how she is feeling.  They indicate she is now used her CPAP.  Sometimes takes it off during the night but basically doing well.  Download reviewed. Pending RFA for coccyx pain. Chronic rhinitis for which she indicates she has a lot of nasal sprays.  I made sure she was not overusing nasal decongestants.  She is followed by her allergist and is on allergy vaccine.  11/14/22- 77 year old female never smoker followed for OSA  asthma/COPD , complicated by  rhinitis (Dr. [place] Callas), gout, DM2, Macular Degeneration, CAD, Obesity, Hypothyroid,  CPAP auto 5-20/ [facility]    Night Guard Mouthpiece for bruxism -Symbicort 160, Flonase, Uniphyl 400, Singulair, Spiriva 1.25, Ventolin hfa, neb Duoneb, Temazepam,  Download-compliance  Body weight today-135 lbs  (at her 2019 NPSG she weighted 210 lbs) -----OSA on cpap, Breathing has been good She has dieted off about 75 lbs since her sleep study. Not using CPAP and thinks she is sleeping ok. Occasional  nap. Notes leg edema L>R, x 3-4 months. PCP gave lasix which has helped some. She can't tell if theophylline helps now. No recent respiratory exacerbation. UTD flu and covid vax.  ROS-see HPI   + = positive Constitutional:    +weight loss, night sweats, fevers, chills, fatigue, lassitude. HEENT:    headaches, difficulty swallowing, tooth/dental problems, sore throat,       sneezing, itching, ear ache, nasal congestion, post nasal drip, snoring CV:    chest pain, orthopnea, PND, +swelling in lower extremities, anasarca,                                                      dizziness, palpitations Resp:   shortness of breath with exertion or at rest.                productive cough,   non-productive cough, coughing up of blood.              change in color of mucus.  wheezing.   Skin:    rash or lesions. GI:  No-   heartburn, indigestion, abdominal pain, nausea, vomiting, diarrhea,                 change in bowel habits, loss of appetite GU: dysuria, change in color of urine, no urgency or frequency.   flank pain. MS:   joint pain, stiffness, decreased range of motion, back pain. Neuro-     nothing unusual Psych:  change in mood or affect.  depression or anxiety.   memory loss.  OBJ- Physical  Exam General- Alert, Oriented, Affect-appropriate, Distress- none acute,  Skin- rash-none, lesions- none, excoriation- none Lymphadenopathy- none Head- atraumatic            Eyes- Gross vision intact, PERRLA, conjunctivae and secretions clear            Ears-+ bilateral hearing aids            Nose- Clear, no-Septal dev, mucus, polyps, erosion, perforation             Throat- Mallampati IV , mucosa clear , drainage- none, tonsils- atrophic Neck- flexible , trachea midline, no stridor , thyroid nl, carotid no bruit Chest - symmetrical excursion , unlabored           Heart/CV- RRR , no murmur , no gallop  , no rub, nl s1 s2                           - JVD- none , edema + L>R, stasis changes- none,  varices- none           Lung- clear to P&A, wheeze- none, cough- none , dullness-none, rub- none           Chest wall-  Abd-  Br/ Gen/ Rectal- Not done, not indicated Extrem- cyanosis- none, clubbing, none, atrophy- none, strength- nl Neuro- grossly intact to observation

## 2022-11-14 ENCOUNTER — Encounter: Payer: Self-pay | Admitting: Internal Medicine

## 2022-11-14 ENCOUNTER — Ambulatory Visit: Payer: Medicare Other | Admitting: Internal Medicine

## 2022-11-14 VITALS — BP 110/78 | HR 71 | Ht 61.0 in | Wt 135.0 lb

## 2022-11-14 DIAGNOSIS — G4733 Obstructive sleep apnea (adult) (pediatric): Secondary | ICD-10-CM | POA: Diagnosis not present

## 2022-11-14 DIAGNOSIS — R600 Localized edema: Secondary | ICD-10-CM

## 2022-11-14 DIAGNOSIS — J4530 Mild persistent asthma, uncomplicated: Secondary | ICD-10-CM | POA: Diagnosis not present

## 2022-11-14 NOTE — Assessment & Plan Note (Signed)
Not feeling that she needs CPAP since weight loss. Plan- try off CPAP as discussed. If necessary we can renew or update sleep study.

## 2022-11-14 NOTE — Patient Instructions (Addendum)
Ok to stop CPAP now and see how you do without it.  Order- doppler bilateral leg veins- peripheral edema  Also ok to stop your Uniphyl (theophylline) and see how you do without that.  Please call if we can help

## 2022-11-14 NOTE — Assessment & Plan Note (Signed)
Doing very well Plan- try off theophylline

## 2022-11-20 ENCOUNTER — Ambulatory Visit (HOSPITAL_COMMUNITY)
Admission: RE | Admit: 2022-11-20 | Discharge: 2022-11-20 | Disposition: A | Discharge status: Home / Self Care | Payer: Medicare Other | Source: Ambulatory Visit | Attending: Internal Medicine | Admitting: Internal Medicine

## 2022-11-20 DIAGNOSIS — R600 Localized edema: Secondary | ICD-10-CM | POA: Insufficient documentation

## 2022-12-11 DIAGNOSIS — R600 Localized edema: Secondary | ICD-10-CM | POA: Insufficient documentation

## 2022-12-11 NOTE — Assessment & Plan Note (Signed)
Recent months, some response to lasix. Has not ruled out clot Plan- Doppler leg veeins

## 2022-12-17 ENCOUNTER — Encounter (INDEPENDENT_AMBULATORY_CARE_PROVIDER_SITE_OTHER): Payer: Medicare Other | Admitting: Ophthalmology

## 2022-12-17 DIAGNOSIS — Z961 Presence of intraocular lens: Secondary | ICD-10-CM

## 2022-12-17 DIAGNOSIS — Z7984 Long term (current) use of oral hypoglycemic drugs: Secondary | ICD-10-CM

## 2022-12-17 DIAGNOSIS — H353132 Nonexudative age-related macular degeneration, bilateral, intermediate dry stage: Secondary | ICD-10-CM

## 2022-12-17 DIAGNOSIS — H35373 Puckering of macula, bilateral: Secondary | ICD-10-CM

## 2022-12-17 DIAGNOSIS — E119 Type 2 diabetes mellitus without complications: Secondary | ICD-10-CM

## 2022-12-23 NOTE — Progress Notes (Signed)
[facility] - Clinic Note  12/31/2022   CHIEF COMPLAINT Patient presents for Retina Follow Up  HISTORY OF PRESENT ILLNESS: Diana [name] is a 77 y.o. female who presents to the clinic today for:  HPI     Retina Follow Up   Patient presents with  Other.  In both eyes.  This started 7 months ago.  I, the attending physician,  performed the HPI with the patient and updated documentation appropriately.        Comments   Patient here for 7 months retina follow up for ERM OU. Patient states vision  doing fine. No eye pain.       Last edited by Rennis Chris, MD on 12/31/2022 11:48 PM.     Pt states she sees Dr. Lottie Dawson for glaucoma, she's also had punctal plugs put in by a dr with [facility]   Referring physician: Mat Carne, DO [address] [place],  [place] [address]  HISTORICAL INFORMATION:  Selected notes from the MEDICAL RECORD NUMBER Referred by Dr. Jimmey Ralph for ERM w/ cystic changes OU LEE:  Ocular Hx- PMH-   CURRENT MEDICATIONS: No current outpatient medications on file. (Ophthalmic Drugs)   No current facility-administered medications for this visit. (Ophthalmic Drugs)   Current Outpatient Medications (Other)  Medication Sig   albuterol (VENTOLIN HFA) 108 (90 Base) MCG/ACT inhaler INHALE 2 PUFFS INTO THE LUNGS EVERY 6 HOURS AS NEEDED FOR WHEEZING OR SHORTNESS OF BREATH   allopurinol (ZYLOPRIM) 300 MG tablet Take 300 mg by mouth daily.   aspirin EC 81 MG tablet Take 81 mg by mouth daily.   budesonide-formoterol (SYMBICORT) 160-4.5 MCG/ACT inhaler INHALE 2 PUFFS INTO THE LUNGS TWICE DAILY   CALCIUM-MAGNESIUM PO Take 1 tablet by mouth daily.   cetirizine (ZYRTEC) 10 MG tablet Take 10 mg by mouth daily.   cholecalciferol (VITAMIN D) 1000 units tablet Take 1,000 Units by mouth daily.   diazepam (VALIUM) 5 MG tablet Take 5 mg by mouth every 6 (six) hours as needed for anxiety.   diphenhydrAMINE (BENADRYL) 25 mg capsule Take 25 mg by mouth every  6 (six) hours as needed for allergies.   EPINEPHrine 0.3 mg/0.3 mL IJ SOAJ injection Inject 0.3 mg into the muscle as needed (anaphylaxis).   Estradiol 10 MCG TABS vaginal tablet Place vaginally.   fluticasone (FLONASE) 50 MCG/ACT nasal spray SHAKE LIQUID AND USE 2 SPRAYS IN EACH NOSTRIL EVERY DAY AS NEEDED   FOLIC ACID PO Take 1 tablet by mouth daily.   furosemide (LASIX) 20 MG tablet Take 20 mg by mouth daily.   ipratropium-albuterol (DUONEB) 0.5-2.5 (3) MG/3ML SOLN Take 3 mLs by nebulization every 6 (six) hours as needed (shortness of breath).   levothyroxine (SYNTHROID) 50 MCG tablet TAKE 1 TABLET BY MOUTH DAILY   metFORMIN (GLUCOPHAGE) 500 MG tablet Take 500 mg by mouth 2 (two) times daily with a meal.   montelukast (SINGULAIR) 10 MG tablet Take 1 tablet (10 mg total) by mouth at bedtime.   Multiple Vitamins-Minerals (MULTIVITAMIN WITH MINERALS) tablet Take 1 tablet by mouth daily.   niacin (NIASPAN) 500 MG CR tablet 500 mg at bedtime.   niacin 500 MG tablet Take 500 mg by mouth at bedtime.   OMEGA-3 FATTY ACIDS PO Take 1 capsule by mouth daily.   potassium chloride SA (K-DUR,KLOR-CON) 20 MEQ tablet Take 20 mEq by mouth daily.   simvastatin (ZOCOR) 20 MG tablet TK 1 T PO HS   temazepam (RESTORIL) 30  MG capsule 30 mg at bedtime as needed.   theophylline (UNIPHYL) 400 MG 24 hr tablet TAKE 2 TABLETS BY MOUTH EVERY DAY   thiamine (VITAMIN B-1) 50 MG tablet Take 50 mg by mouth daily.   Tiotropium Bromide Monohydrate (SPIRIVA RESPIMAT) 1.25 MCG/ACT AERS INHALE 2 PUFFS INTO THE LUNGS DAILY   vitamin B-12 (CYANOCOBALAMIN) 1000 MCG tablet Take 1,000 mcg by mouth daily.   vitamin E 400 UNIT capsule Take 400 Units by mouth daily.   No current facility-administered medications for this visit. (Other)   REVIEW OF SYSTEMS: ROS   Positive for: Endocrine, Eyes Last edited by Laddie Aquas, COA on 12/31/2022  1:42 PM.      ALLERGIES Allergies  Allergen Reactions   Molds & Smuts Shortness  Of Breath   Other Shortness Of Breath    CAT   Pollen Extract Shortness Of Breath   PAST MEDICAL HISTORY Past Medical History:  Diagnosis Date   Allergic rhinitis    Asthma    COPD (chronic obstructive pulmonary disease) (HCC)    Gout    Left bundle branch block    Sleep apnea    Umbilical hernia    Past Surgical History:  Procedure Laterality Date   ADENOIDECTOMY     BREAST LUMPECTOMY Right    CARDIAC CATHETERIZATION     CATARACT EXTRACTION, BILATERAL     ROOT CANAL     SKIN BIOPSY     TONSILLECTOMY     WISDOM TOOTH EXTRACTION     FAMILY HISTORY Family History  Problem Relation Age of Onset   Heart disease Mother    Stroke Brother    SOCIAL HISTORY Social History   Tobacco Use   Smoking status: Never   Smokeless tobacco: Never  Vaping Use   Vaping status: Never Used  Substance Use Topics   Alcohol use: No   Drug use: No       OPHTHALMIC EXAM:  Base Eye Exam     Visual Acuity (Snellen - Linear)       Right Left   Dist [place] 20/25 +1 20/50   Dist ph [place] NI 20/30 -1         Tonometry (Tonopen, 1:40 PM)       Right Left   Pressure 11 11         Pupils       Dark Light Shape React APD   Right 3 2 Round Brisk None   Left 3 2 Round Brisk None         Visual Fields (Counting fingers)       Left Right    Full Full         Extraocular Movement       Right Left    Full, Ortho Full, Ortho         Neuro/Psych     Oriented x3: Yes   Mood/Affect: Normal         Dilation     Both eyes: 1.0% Mydriacyl, 2.5% Phenylephrine @ 1:39 PM           Slit Lamp and Fundus Exam     Slit Lamp Exam       Right Left   Lids/Lashes Dermatochalasis - upper lid, Meibomian gland dysfunction, mild Telangiectasia Dermatochalasis - upper lid, Meibomian gland dysfunction, mild Telangiectasia   Conjunctiva/Sclera White and quiet White and quiet   Cornea Trace Punctate epithelial erosions, well healed cataract wound 1+ inferior Punctate  epithelial erosions, well  healed cataract wound   Anterior Chamber deep and clear deep and clear   Iris Round and dilated Round and dilated   Lens PC IOL in good position Toric PC IOL in good position with marks at 0730 and 0130   Anterior Vitreous mild syneresis, Posterior vitreous detachment mild syneresis, Posterior vitreous detachment         Fundus Exam       Right Left   Disc mild Pallor, Sharp rim Pink and Sharp, thin inferior rim   C/D Ratio 0.6 0.7   Macula Flat, Blunted foveal reflex, RPE mottling, mild ERM with central cystic changs, No heme or edema Flat, Blunted foveal reflex, drusen, RPE mottling, mild ERM with trace cystic changs, No heme or edema   Vessels attenuated, mild tortuosity attenuated, mild tortuosity   Periphery Attached, 360 reticular degeneration, scattered peripheral drusen, No heme Attached, 360 reticular degeneration, scattered peripheral drusen, No heme           IMAGING AND PROCEDURES  Imaging and Procedures for 12/31/2022  OCT, Retina - OU - Both Eyes       Right Eye Quality was good. Central Foveal Thickness: 333. Findings include no SRF, abnormal foveal contour, retinal drusen , epiretinal membrane, intraretinal fluid, outer retinal atrophy, vitreomacular adhesion (Mild ERM with central cystic changes and central foveal notch).   Left Eye Quality was good. Central Foveal Thickness: 274. Progression has been stable. Findings include normal foveal contour, no SRF, retinal drusen , intraretinal fluid, vitreomacular adhesion (ERM with central cystic changes).   Notes *Images captured and stored on drive  Diagnosis / Impression:  OD: Mild ERM with central cystic changes and central foveal notch OS: ERM with central cystic changes  Clinical management:  See below  Abbreviations: NFP - Normal foveal profile. CME - cystoid macular edema. PED - pigment epithelial detachment. IRF - intraretinal fluid. SRF - subretinal fluid. EZ - ellipsoid zone.  ERM - epiretinal membrane. ORA - outer retinal atrophy. ORT - outer retinal tubulation. SRHM - subretinal hyper-reflective material. IRHM - intraretinal hyper-reflective material            ASSESSMENT/PLAN:   ICD-10-CM   1. Epiretinal membrane (ERM) of both eyes  H35.373 OCT, Retina - OU - Both Eyes    2. Intermediate stage nonexudative age-related macular degeneration of both eyes  H35.3132     3. Diabetes mellitus type 2 without retinopathy (HCC)  E11.9     4. Long term (current) use of oral hypoglycemic drugs  Z79.84     5. Pseudophakia of both eyes  Z96.1      Epiretinal membrane, both eyes  - mild ERM w/ central cystic changes OU; OD with central foveal notch - BCVA OD: 20/25 -- decreased from 20/20, OS: improved to 20/30 from 20/40 - OCT shows central cystic changes OU - FA (05.20.24) shows no central leakage or staining within central cysts - asymptomatic, no metamorphopsia - no indication for surgery at this time - monitor for now - f/u 6-9 mos -- DFE/OCT  2. Age related macular degeneration, non-exudative, both eyes  - intermediate stage  - The incidence, anatomy, and pathology of dry AMD, risk of progression, and the AREDS and AREDS 2 study including smoking risks discussed with patient.  - Recommend amsler grid monitoring  - monitor  3,4. 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   -  The need for close monitoring of blood glucose, blood pressure, and serum lipids, avoiding cigarette or any type of tobacco, and the need for long term follow up was also discussed with patient. - f/u in 1 year, sooner prn  5. Pseudophakia OU  - s/p CE/IOL OU  - IOL in good position, doing well  - monitor  Ophthalmic Meds Ordered this visit:  No orders of the defined types were placed in this encounter.    Return for f/u 6-9 months,  ERM OU, DFE, OCT.  There are no Patient Instructions on file for this visit.  Explained the diagnoses, plan, and follow up with the patient and they expressed understanding.  Patient expressed understanding of the importance of proper follow up care.   This document serves as a record of services personally performed by Karie Chimera, MD, PhD. It was created on their behalf by Charlette Caffey, COT an ophthalmic technician. The creation of this record is the provider's dictation and/or activities during the visit.    Electronically signed by:  Charlette Caffey, COT  12/31/22 11:48 PM  This document serves as a record of services personally performed by Karie Chimera, MD, PhD. It was created on their behalf by Glee Arvin. Manson Passey, OA an ophthalmic technician. The creation of this record is the provider's dictation and/or activities during the visit.    Electronically signed by: Glee Arvin. Manson Passey, OA 12/31/22 11:48 PM  Karie Chimera, M.D., Ph.D. Diseases & Surgery of the Retina and Vitreous [facility] 12/31/2022  I have reviewed the above documentation for accuracy and completeness, and I agree with the above. Karie Chimera, M.D., Ph.D. 12/31/22 11:49 PM   Abbreviations: M myopia (nearsighted); A astigmatism; H hyperopia (farsighted); P presbyopia; Mrx spectacle prescription;  CTL contact lenses; OD right eye; OS left eye; OU both eyes  XT exotropia; ET esotropia; PEK punctate epithelial keratitis; PEE punctate epithelial erosions; DES dry eye syndrome; MGD meibomian gland dysfunction; ATs artificial tears; PFAT's preservative free artificial tears; NSC nuclear sclerotic cataract; PSC posterior subcapsular cataract; ERM epi-retinal membrane; PVD posterior vitreous detachment; RD retinal detachment; DM diabetes mellitus; DR diabetic retinopathy; NPDR non-proliferative diabetic retinopathy; PDR proliferative diabetic retinopathy; CSME clinically significant macular  edema; DME diabetic macular edema; dbh dot blot hemorrhages; CWS cotton wool spot; POAG primary open angle glaucoma; C/D cup-to-disc ratio; HVF humphrey visual field; GVF goldmann visual field; OCT optical coherence tomography; IOP intraocular pressure; BRVO Branch retinal vein occlusion; CRVO central retinal vein occlusion; CRAO central retinal artery occlusion; BRAO branch retinal artery occlusion; RT retinal tear; SB scleral buckle; PPV pars plana vitrectomy; VH Vitreous hemorrhage; PRP panretinal laser photocoagulation; IVK intravitreal kenalog; VMT vitreomacular traction; MH Macular hole;  NVD neovascularization of the disc; NVE neovascularization elsewhere; AREDS age related eye disease study; ARMD age related macular degeneration; POAG primary open angle glaucoma; EBMD epithelial/anterior basement membrane dystrophy; ACIOL anterior chamber intraocular lens; IOL intraocular lens; PCIOL posterior chamber intraocular lens; Phaco/IOL phacoemulsification with intraocular lens placement; PRK photorefractive keratectomy; LASIK laser assisted in situ keratomileusis; HTN hypertension; DM diabetes mellitus; COPD chronic obstructive pulmonary disease

## 2022-12-30 NOTE — Progress Notes (Signed)
  Cardiology Office Note:  .   Date:  01/07/2023  ID:  Diana [name], DOB 12-25-1945, MRN [id number] PCP: Little Ishikawa, MD  [facility] Providers Cardiologist:  None    History of Present Illness: .   Diana [name] is a 77 y.o. female with history of LBBB, false positive NST 2008 with mild CAD, COPD & asthma, OSA, HLD and prediabetes.  Patient last seen 2021.  Patient comes in alone. She is using a walker and has allergy problems. Denies chest pain, palpitations, dyspnea, edema. Has lost 50-60 lbs over several years unintentional.Only eating during meal time. No regular exercise due to asthma and balance issues. Uses CPAP on occasion since weight loss. Labs reviewed in care everywhere.  ROS:    Studies Reviewed: Marland Kitchen    EKG Interpretation Date/Time:  Tuesday January 07 2023 09:51:40 EST Ventricular Rate:  61 PR Interval:  128 QRS Duration:  138 QT Interval:  436 QTC Calculation: 438 R Axis:   -6  Text Interpretation: Sinus rhythm with Premature atrial complexes Left bundle branch block No previous ECGs available Confirmed by Jacolyn Reedy [phone]) on 01/07/2023 9:53:04 AM    Prior CV Studies:      Risk Assessment/Calculations:             Physical Exam:   VS:  BP 136/62 (BP Location: Left Arm, Patient Position: Supine)   Pulse 69   Ht 5\' 1"  (1.549 m)   Wt 137 lb 3.2 oz (62.2 kg)   SpO2 97%   BMI 25.92 kg/m    Wt Readings from Last 3 Encounters:  01/07/23 137 lb 3.2 oz (62.2 kg)  11/14/22 135 lb (61.2 kg)  11/08/21 154 lb 3.2 oz (69.9 kg)    GEN: Well nourished, well developed in no acute distress NECK: No JVD; No carotid bruits CARDIAC:  RRR, 2/6 systolic murmur LSB, S4 RESPIRATORY:  Clear to auscultation without rales, wheezing or rhonchi  ABDOMEN: Soft, non-tender, non-distended EXTREMITIES:  No edema; No deformity   ASSESSMENT AND PLAN: .    CAD-30% lesions on cath 2008-no chest pain, on ASA, zocor  LBBB  OSA-trying to come off CPAP  with weight loss  HLD-LDL 58, TC 177, trig 91 09/2022  Obesity-has lost 50-60 lbs over 4 yrs.   Diabetes no history of HTN on losartan-recently started  COPD/asthma        Dispo: f/u in 1 yr.  Signed, Jacolyn Reedy, PA-C

## 2022-12-31 ENCOUNTER — Ambulatory Visit (INDEPENDENT_AMBULATORY_CARE_PROVIDER_SITE_OTHER): Payer: Medicare Other | Admitting: Ophthalmology

## 2022-12-31 ENCOUNTER — Encounter (INDEPENDENT_AMBULATORY_CARE_PROVIDER_SITE_OTHER): Payer: Self-pay | Admitting: Ophthalmology

## 2022-12-31 DIAGNOSIS — H35373 Puckering of macula, bilateral: Secondary | ICD-10-CM

## 2022-12-31 DIAGNOSIS — Z7984 Long term (current) use of oral hypoglycemic drugs: Secondary | ICD-10-CM | POA: Diagnosis not present

## 2022-12-31 DIAGNOSIS — E119 Type 2 diabetes mellitus without complications: Secondary | ICD-10-CM | POA: Diagnosis not present

## 2022-12-31 DIAGNOSIS — H353132 Nonexudative age-related macular degeneration, bilateral, intermediate dry stage: Secondary | ICD-10-CM

## 2022-12-31 DIAGNOSIS — Z961 Presence of intraocular lens: Secondary | ICD-10-CM

## 2023-01-07 ENCOUNTER — Encounter: Payer: Self-pay | Admitting: Physician Assistant

## 2023-01-07 ENCOUNTER — Ambulatory Visit: Payer: Medicare Other | Attending: Physician Assistant | Admitting: Physician Assistant

## 2023-01-07 VITALS — BP 136/62 | HR 69 | Ht 61.0 in | Wt 137.2 lb

## 2023-01-07 DIAGNOSIS — I2510 Atherosclerotic heart disease of native coronary artery without angina pectoris: Secondary | ICD-10-CM | POA: Insufficient documentation

## 2023-01-07 DIAGNOSIS — J4489 Other specified chronic obstructive pulmonary disease: Secondary | ICD-10-CM | POA: Insufficient documentation

## 2023-01-07 DIAGNOSIS — I447 Left bundle-branch block, unspecified: Secondary | ICD-10-CM | POA: Insufficient documentation

## 2023-01-07 DIAGNOSIS — G4733 Obstructive sleep apnea (adult) (pediatric): Secondary | ICD-10-CM | POA: Diagnosis present

## 2023-01-07 DIAGNOSIS — E119 Type 2 diabetes mellitus without complications: Secondary | ICD-10-CM | POA: Diagnosis present

## 2023-01-07 DIAGNOSIS — E782 Mixed hyperlipidemia: Secondary | ICD-10-CM | POA: Insufficient documentation

## 2023-01-07 NOTE — Patient Instructions (Signed)
Medication Instructions:  Your physician recommends that you continue on your current medications as directed. Please refer to the Current Medication list given to you today.  *If you need a refill on your cardiac medications before your next appointment, please call your pharmacy*   Lab Work: None ordered   If you have labs (blood work) drawn today and your tests are completely normal, you will receive your results only by: MyChart Message (if you have MyChart) OR A paper copy in the mail If you have any lab test that is abnormal or we need to change your treatment, we will call you to review the results.   Testing/Procedures: None ordered    Follow-Up: At [facility], you and your health needs are our priority.  As part of our continuing mission to provide you with exceptional heart care, we have created designated Provider Care Teams.  These Care Teams include your primary Cardiologist (physician) and Advanced Practice Providers (APPs -  Physician Assistants and Nurse Practitioners) who all work together to provide you with the care you need, when you need it.  We recommend signing up for the patient portal called "MyChart".  Sign up information is provided on this After Visit Summary.  MyChart is used to connect with patients for Virtual Visits (Telemedicine).  Patients are able to view lab/test results, encounter notes, upcoming appointments, etc.  Non-urgent messages can be sent to your provider as well.   To learn more about what you can do with MyChart, go to [email or web address].    Your next appointment:   12 month(s)  Provider:   Dr. Epifanio Lesches  Other Instructions

## 2023-03-17 ENCOUNTER — Telehealth: Payer: Self-pay | Admitting: Neurology

## 2023-03-17 ENCOUNTER — Encounter: Payer: Self-pay | Admitting: Neurology

## 2023-03-17 ENCOUNTER — Ambulatory Visit (INDEPENDENT_AMBULATORY_CARE_PROVIDER_SITE_OTHER): Payer: Medicare Other | Admitting: Neurology

## 2023-03-17 VITALS — BP 117/56 | HR 74 | Ht 61.0 in | Wt 137.0 lb

## 2023-03-17 DIAGNOSIS — R269 Unspecified abnormalities of gait and mobility: Secondary | ICD-10-CM | POA: Diagnosis not present

## 2023-03-17 DIAGNOSIS — R296 Repeated falls: Secondary | ICD-10-CM | POA: Diagnosis not present

## 2023-03-17 DIAGNOSIS — R419 Unspecified symptoms and signs involving cognitive functions and awareness: Secondary | ICD-10-CM

## 2023-03-17 NOTE — Telephone Encounter (Signed)
 no auth required sent to GI [phone]

## 2023-03-17 NOTE — Patient Instructions (Signed)
 Continue current medications Will obtain MRI brain without contrast Referral to physical therapy for gait training Follow-up as needed.   There are well-accepted and sensible ways to reduce risk for Alzheimers disease and other degenerative brain disorders .  Exercise Daily Walk A daily 20 minute walk should be part of your routine. Disease related apathy can be a significant roadblock to exercise and the only way to overcome this is to make it a daily routine and perhaps have a reward at the end (something your loved one loves to eat or drink perhaps) or a personal trainer coming to the home can also be very useful. Most importantly, the patient is much more likely to exercise if the caregiver / spouse does it with him/her. In general a structured, repetitive schedule is best.  General Health: Any diseases which effect your body will effect your brain such as a pneumonia, urinary infection, blood clot, heart attack or stroke. Keep contact with your primary care doctor for regular follow ups.  Sleep. A good nights sleep is healthy for the brain. Seven hours is recommended. If you have insomnia or poor sleep habits we can give you some instructions. If you have sleep apnea wear your mask.  Diet: Eating a heart healthy diet is also a good idea; fish and poultry instead of red meat, nuts (mostly non-peanuts), vegetables, fruits, olive oil or canola oil (instead of butter), minimal salt (use other spices to flavor foods), whole grain rice, bread, cereal and pasta and wine in moderation.Research is now showing that the MIND diet, which is a combination of The Mediterranean diet and the DASH diet, is beneficial for cognitive processing and longevity. Information about this diet can be found in The MIND Diet, a book by Alonna Minium, MS, RDN, and online at WildWildScience.es  Finances, Power of [address] and Advance Directives: You should consider putting legal safeguards in place  with regard to financial and medical decision making. While the spouse always has power of attorney for medical and financial issues in the absence of any form, you should consider what you want in case the spouse / caregiver is no longer around or capable of making decisions.

## 2023-03-17 NOTE — Progress Notes (Signed)
 [facility]  PATIENT: Diana [name] DOB: [birth year]  REQUESTING CLINICIAN: Beam, Chales Salmon, MD HISTORY FROM: Patient/Husband  REASON FOR VISIT: Memory loss    HISTORICAL  CHIEF COMPLAINT:  Chief Complaint  Patient presents with   Room 12    Pt is here with her Husband. Pt states that she would like to confirm wether or not she had a Stroke in mid summer 2020. Pt's husband states that her short term memory doesn't work anymore. Pt's husband states that she will ask him a question and he will give it to her and then 2 mins later she will ask him the same question again. Pt's husband states that pt has lost a lot of her physical strength. Pt's husband states that pt has had 7 maybe 8 falls recently. Pt's husband states that pt fell a couple   cont.    Of days ago. Pt's husband states that pt's vision hasn't been good lately. Pt's husband states that pt has lost interest in things around the house. Pt's husband states that he will remind her about her trash that she was supposed to pick up and she will start to do it and then lose track of what she was supposed to be doing.     HISTORY OF PRESENT ILLNESS:  This is 78 year old woman past with multiple medical conditions including hypertension, hyperlipidemia, diabetes, hypothyroidism, sleep apnea, heart and lung disease who was referred by PCP for memory problem.  Patient tells me she does not think that she has a memory problem, she feels like she had a silent stroke in the summer of 2020.  She could not give me any detail around the stroke, denies any focal deficit at that time but felt having a silent stroke in 2020.  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, then after retiring she worked with [employer] but during COVID she has stopped working and completely isolated herself.  Patient sister also died during COVID.  She developed back pain after driving a few days back and  forth to visit her brother in [place].  Husband tells me that her short-term memory is affected but patient tells me that sometimes she does not pay attention because it is not important to her or she will just write things down and not worry about it because she can find the information.  They report multiple falls, last fall was Saturday.  She does use a walker with ambulation.   TBI:   No past history of TBI Stroke:   no past history of stroke Seizures:   no past history of seizures Sleep:   Yes, has a history of sleep apnea.  Mood:   patient denies anxiety and depression Family history of Dementia: Not sure, adopted at birth  Functional status: independent in all ADLs and IADLs Patient lives with husband. Cooking: no issues Cleaning: no issues Shopping: no issues Bathing: no issues Toileting:  no issues  Ever left the stove on by accident?: denies Forget how to use items around the house?: denies Getting lost going to familiar places?: denies Forgetting loved ones names?: denies Word finding difficulty? denies Sleep: no issues    OTHER MEDICAL CONDITIONS: Heart disease, lung disease, hypertension, hyperlipidemia, hypothyroidism, diabetes, sleep apnea   REVIEW OF SYSTEMS: Full 14 system review of systems performed and negative with exception of: As noted in the HPI   ALLERGIES: Allergies  Allergen Reactions   Molds &  Smuts Shortness Of Breath   Other Shortness Of Breath    CAT   Pollen Extract Shortness Of Breath    Corn Pollen    HOME MEDICATIONS: Outpatient Medications Prior to Visit  Medication Sig Dispense Refill   albuterol (VENTOLIN HFA) 108 (90 Base) MCG/ACT inhaler INHALE 2 PUFFS INTO THE LUNGS EVERY 6 HOURS AS NEEDED FOR WHEEZING OR SHORTNESS OF BREATH 26.8 g 2   allopurinol (ZYLOPRIM) 300 MG tablet Take 300 mg by mouth daily.     aspirin EC 81 MG tablet Take 81 mg by mouth daily.     budesonide-formoterol (SYMBICORT) 160-4.5 MCG/ACT inhaler INHALE 2 PUFFS INTO  THE LUNGS TWICE DAILY 30.6 g 1   CALCIUM-MAGNESIUM PO Take 1 tablet by mouth daily.     cetirizine (ZYRTEC) 10 MG tablet Take 10 mg by mouth daily.     cholecalciferol (VITAMIN D) 1000 units tablet Take 1,000 Units by mouth daily.     diazepam (VALIUM) 5 MG tablet Take 5 mg by mouth every 6 (six) hours as needed for anxiety.     diphenhydrAMINE (BENADRYL) 25 mg capsule Take 25 mg by mouth every 6 (six) hours as needed for allergies.     Estradiol 10 MCG TABS vaginal tablet Place vaginally.     fluticasone (FLONASE) 50 MCG/ACT nasal spray SHAKE LIQUID AND USE 2 SPRAYS IN EACH NOSTRIL EVERY DAY AS NEEDED 16 g 3   FOLIC ACID PO Take 1 tablet by mouth daily.     furosemide (LASIX) 20 MG tablet Take 20 mg by mouth daily.     ipratropium-albuterol (DUONEB) 0.5-2.5 (3) MG/3ML SOLN Take 3 mLs by nebulization every 6 (six) hours as needed (shortness of breath). 360 mL 3   levothyroxine (SYNTHROID) 50 MCG tablet TAKE 1 TABLET BY MOUTH DAILY     losartan (COZAAR) 25 MG tablet Take 25 mg by mouth daily.     metFORMIN (GLUCOPHAGE) 500 MG tablet Take 500 mg by mouth 2 (two) times daily with a meal.     montelukast (SINGULAIR) 10 MG tablet Take 1 tablet (10 mg total) by mouth at bedtime. 30 tablet 3   Multiple Vitamins-Minerals (MULTIVITAMIN WITH MINERALS) tablet Take 1 tablet by mouth daily.     niacin (NIASPAN) 500 MG CR tablet 500 mg at bedtime.  1   niacin 500 MG tablet Take 500 mg by mouth at bedtime.     OMEGA-3 FATTY ACIDS PO Take 1 capsule by mouth daily.     potassium chloride SA (K-DUR,KLOR-CON) 20 MEQ tablet Take 20 mEq by mouth daily.     simvastatin (ZOCOR) 20 MG tablet TK 1 T PO HS  1   temazepam (RESTORIL) 30 MG capsule 30 mg at bedtime as needed.     theophylline (UNIPHYL) 400 MG 24 hr tablet TAKE 2 TABLETS BY MOUTH EVERY DAY 180 tablet 4   thiamine (VITAMIN B-1) 50 MG tablet Take 50 mg by mouth daily.     Tiotropium Bromide Monohydrate (SPIRIVA RESPIMAT) 1.25 MCG/ACT AERS INHALE 2 PUFFS  INTO THE LUNGS DAILY 12 g 3   vitamin B-12 (CYANOCOBALAMIN) 1000 MCG tablet Take 1,000 mcg by mouth daily.     vitamin E 400 UNIT capsule Take 400 Units by mouth daily.     amoxicillin-clavulanate (AUGMENTIN) 875-125 MG tablet Take 1 tablet by mouth 2 (two) times daily. (Patient not taking: Reported on 03/17/2023)     EPINEPHrine 0.3 mg/0.3 mL IJ SOAJ injection Inject 0.3 mg into the muscle as needed (  anaphylaxis). (Patient not taking: Reported on 03/17/2023)     No facility-administered medications prior to visit.    PAST MEDICAL HISTORY: Past Medical History:  Diagnosis Date   Allergic rhinitis    Asthma    COPD (chronic obstructive pulmonary disease) (HCC)    Gout    Left bundle branch block    Sleep apnea    Umbilical hernia     PAST SURGICAL HISTORY: Past Surgical History:  Procedure Laterality Date   ADENOIDECTOMY     BREAST LUMPECTOMY Right    CARDIAC CATHETERIZATION     CATARACT EXTRACTION, BILATERAL     ROOT CANAL     SKIN BIOPSY     TONSILLECTOMY     WISDOM TOOTH EXTRACTION      FAMILY HISTORY: Family History  Problem Relation Age of Onset   Heart disease Mother    Stroke Brother     SOCIAL HISTORY: Social History   Socioeconomic History   Marital status: Married    Spouse name: Not on file   Number of children: Not on file   Years of education: Not on file   Highest education level: Not on file  Occupational History   Not on file  Tobacco Use   Smoking status: Never   Smokeless tobacco: Never  Vaping Use   Vaping status: Never Used  Substance and Sexual Activity   Alcohol use: No   Drug use: No   Sexual activity: Not on file  Other Topics Concern   Not on file  Social History Narrative   Not on file   Social Drivers of Health   Financial Resource Strain: Low Risk  (03/05/2023)   Received from [facility]   Overall Financial Resource Strain (CARDIA)    Difficulty of Paying Living Expenses: Not hard at all  Food Insecurity: No Food  Insecurity (03/05/2023)   Received from [facility]   Hunger Vital Sign    Worried About Running Out of Food in the Last Year: Never true    Ran Out of Food in the Last Year: Never true  Transportation Needs: No Transportation Needs (03/05/2023)   Received from [facility] - Transportation    Lack of Transportation (Medical): No    Lack of Transportation (Non-Medical): No  Physical Activity: Unknown (11/24/2022)   [facility]   Exercise Vital Sign    Days of Exercise per Week: 0 days    Minutes of Exercise per Session: Not on file  Stress: No Stress Concern Present (11/24/2022)   Received from [facility] - Occupational Stress Questionnaire    Feeling of Stress : Not at all  Social Connections: Socially Integrated (11/24/2022)   Received from [facility]   Social Network    How would you rate your social network (family, work, friends)?: Good participation with social networks  Intimate Partner Violence: Not At Risk (11/24/2022)   Received from [facility]   HITS    Over the last 12 months how often did your partner physically hurt you?: Never    Over the last 12 months how often did your partner insult you or talk down to you?: Never    Over the last 12 months how often did your partner threaten you with physical harm?: Never    Over the last 12 months how often did your partner scream or curse at you?: Never    PHYSICAL EXAM  GENERAL EXAM/CONSTITUTIONAL: Vitals:  Vitals:   03/17/23 1335  BP: (!) 117/56  Pulse: 74  Weight: 137 lb (62.1 kg)  Height: 5\' 1"  (1.549 m)   Body mass index is 25.89 kg/m. Wt Readings from Last 3 Encounters:  03/17/23 137 lb (62.1 kg)  01/07/23 137 lb 3.2 oz (62.2 kg)  11/14/22 135 lb (61.2 kg)   Patient is in no distress; well developed, nourished and groomed; neck is supple  MUSCULOSKELETAL: Gait, strength, tone, movements noted in Neurologic exam  below  NEUROLOGIC: MENTAL STATUS:      No data to display         awake, alert, oriented to person, place and time recent and remote memory intact normal attention and concentration language fluent, comprehension intact, naming intact fund of knowledge appropriate     03/17/2023    1:38 PM  [facility] Cognitive Assessment   Visuospatial/ Executive (0/5) 5  Naming (0/3) 3  Attention: Read list of digits (0/2) 2  Attention: Read list of letters (0/1) 1  Attention: Serial 7 subtraction starting at 100 (0/3) 2  Language: Repeat phrase (0/2) 2  Language : Fluency (0/1) 1  Abstraction (0/2) 2  Delayed Recall (0/5) 2  Orientation (0/6) 6  Total 26  Adjusted Score (based on education) 26    CRANIAL NERVE:  2nd, 3rd, 4th, 6th- visual fields full to confrontation, extraocular muscles intact, no nystagmus 5th - facial sensation symmetric 7th - facial strength symmetric 8th - hearing intact 9th - palate elevates symmetrically, uvula midline 11th - shoulder shrug symmetric 12th - tongue protrusion midline  MOTOR:  normal bulk and tone, full strength in the BUE, BLE  SENSORY:  normal and symmetric to light touch  COORDINATION:  finger-nose-finger, fine finger movements normal  GAIT/STATION:  Ambulates with a walker     DIAGNOSTIC DATA (LABS, IMAGING, TESTING) - I reviewed patient records, labs, notes, testing and imaging myself where available.  Lab Results  Component Value Date   WBC 8.5 11/24/2018   HGB 15.0 11/24/2018   HCT 44.1 11/24/2018   MCV 100.5 (H) 11/24/2018   PLT 259 11/24/2018      Component Value Date/Time   NA 144 11/24/2018 1309   K 3.8 11/24/2018 1309   CL 104 11/24/2018 1309   CO2 31 11/24/2018 1309   GLUCOSE 141 (H) 11/24/2018 1309   BUN 16 11/24/2018 1309   CREATININE 0.91 11/24/2018 1309   CALCIUM 10.5 (H) 11/24/2018 1309   PROT 6.0 (L) 07/27/2017 1543   ALBUMIN 3.8 07/27/2017 1543   AST 25 07/27/2017 1543   ALT 30 07/27/2017 1543    ALKPHOS 75 07/27/2017 1543   BILITOT 0.8 07/27/2017 1543   GFRNONAA >60 07/27/2017 1543   GFRAA >60 07/27/2017 1543   No results found for: "CHOL", "HDL", "LDLCALC", "LDLDIRECT", "TRIG", "CHOLHDL" No results found for: "HGBA1C" No results found for: "VITAMINB12" No results found for: "TSH"    ASSESSMENT AND PLAN  78 y.o. year old female with hypertension, hyperlipidemia, diabetes, hypothyroidism, sleep apnea, heart and lung disease who was referred by PCP for memory problem.  She has normal cognition with a normal MoCA of 26.  With her multiple vascular risk factors, will obtain MRI brain.   She is a high fall risk, advised her to use a walker with ambulation but will refer patient to physical therapy for gait training.  Continue to follow with PCP and return as needed.   1. Cognitive complaints with normal exam   2. Gait abnormality  3. Multiple falls     Patient Instructions  Continue current medications Will obtain MRI brain without contrast Referral to physical therapy for gait training Follow-up as needed.   There are well-accepted and sensible ways to reduce risk for Alzheimers disease and other degenerative brain disorders .  Exercise Daily Walk A daily 20 minute walk should be part of your routine. Disease related apathy can be a significant roadblock to exercise and the only way to overcome this is to make it a daily routine and perhaps have a reward at the end (something your loved one loves to eat or drink perhaps) or a personal trainer coming to the home can also be very useful. Most importantly, the patient is much more likely to exercise if the caregiver / spouse does it with him/her. In general a structured, repetitive schedule is best.  General Health: Any diseases which effect your body will effect your brain such as a pneumonia, urinary infection, blood clot, heart attack or stroke. Keep contact with your primary care doctor for regular follow ups.  Sleep. A  good nights sleep is healthy for the brain. Seven hours is recommended. If you have insomnia or poor sleep habits we can give you some instructions. If you have sleep apnea wear your mask.  Diet: Eating a heart healthy diet is also a good idea; fish and poultry instead of red meat, nuts (mostly non-peanuts), vegetables, fruits, olive oil or canola oil (instead of butter), minimal salt (use other spices to flavor foods), whole grain rice, bread, cereal and pasta and wine in moderation.Research is now showing that the MIND diet, which is a combination of The Mediterranean diet and the DASH diet, is beneficial for cognitive processing and longevity. Information about this diet can be found in The MIND Diet, a book by Alonna Minium, MS, RDN, and online at [employer]  Finances, Power of [address] and Advance Directives: You should consider putting legal safeguards in place with regard to financial and medical decision making. While the spouse always has power of attorney for medical and financial issues in the absence of any form, you should consider what you want in case the spouse / caregiver is no longer around or capable of making decisions.   Orders Placed This Encounter  Procedures   MR BRAIN WO CONTRAST   Ambulatory referral to Physical Therapy    No orders of the defined types were placed in this encounter.   Return if symptoms worsen or fail to improve.  I have spent a total of 60 minutes dedicated to this patient today, preparing to see patient, performing a medically appropriate examination and evaluation, ordering tests and/or medications and procedures, and counseling and educating the patient/family/caregiver; independently interpreting result and communicating results to the family/patient/caregiver; and documenting clinical information in the electronic medical record.   Windell Norfolk, MD 03/17/2023, 5:33 PM  [facility] [address] [place], [place] [address] [phone]

## 2023-03-18 ENCOUNTER — Ambulatory Visit: Payer: Medicare Other | Admitting: Internal Medicine

## 2023-03-21 ENCOUNTER — Other Ambulatory Visit: Payer: Self-pay

## 2023-03-21 ENCOUNTER — Ambulatory Visit: Payer: Medicare Other | Attending: Neurology | Admitting: Physical Therapy

## 2023-03-21 ENCOUNTER — Encounter: Payer: Self-pay | Admitting: Physical Therapy

## 2023-03-21 VITALS — BP 99/72 | HR 69

## 2023-03-21 DIAGNOSIS — M6281 Muscle weakness (generalized): Secondary | ICD-10-CM | POA: Insufficient documentation

## 2023-03-21 DIAGNOSIS — R296 Repeated falls: Secondary | ICD-10-CM | POA: Insufficient documentation

## 2023-03-21 DIAGNOSIS — R419 Unspecified symptoms and signs involving cognitive functions and awareness: Secondary | ICD-10-CM | POA: Diagnosis not present

## 2023-03-21 DIAGNOSIS — R2681 Unsteadiness on feet: Secondary | ICD-10-CM | POA: Insufficient documentation

## 2023-03-21 DIAGNOSIS — R269 Unspecified abnormalities of gait and mobility: Secondary | ICD-10-CM | POA: Diagnosis not present

## 2023-03-21 DIAGNOSIS — R2689 Other abnormalities of gait and mobility: Secondary | ICD-10-CM | POA: Diagnosis present

## 2023-03-21 NOTE — Therapy (Signed)
 OUTPATIENT PHYSICAL THERAPY NEURO EVALUATION   Patient Name: Diana [name] MRN: [id number] DOB:1945/04/16, 78 y.o., female Today's Date: 03/21/2023   PCP: Avel Peace Chales Salmon, MD REFERRING PROVIDER: Windell Norfolk, MD  END OF SESSION:  PT End of Session - 03/21/23 1408     Visit Number 1    Number of Visits 17   16 + eval   Date for PT Re-Evaluation 05/23/23   pushed out to accommodate pt scheduling needs   Authorization Type Medicare A&B/BCBS/Aet[employer] Supplement [id number]    Progress Note Due on Visit 10    PT Start Time 1402    PT Stop Time 1445    PT Time Calculation (min) 43 min    Equipment Utilized During Treatment Gait belt    Activity Tolerance Other (comment)    Behavior During Therapy WFL for tasks assessed/performed   Assessment limited by hyperverbosity w/o redirection and pt on phone scrolling throughout session.            Past Medical History:  Diagnosis Date   Allergic rhinitis    Asthma    COPD (chronic obstructive pulmonary disease) (HCC)    Gout    Left bundle branch block    Sleep apnea    Umbilical hernia    Past Surgical History:  Procedure Laterality Date   ADENOIDECTOMY     BREAST LUMPECTOMY Right    CARDIAC CATHETERIZATION     CATARACT EXTRACTION, BILATERAL     ROOT CANAL     SKIN BIOPSY     TONSILLECTOMY     WISDOM TOOTH EXTRACTION     Patient Active Problem List   Diagnosis Date Noted   Peripheral edema 12/11/2022   Rhinitis, non-allergic 11/07/2019   Burn 11/10/2018   Severe obesity (BMI 35.0-39.9) with comorbidity (HCC) 12/15/2017   Asthma with COPD (HCC) 10/22/2017   Type 2 diabetes mellitus without complication, without long-term current use of insulin (HCC) 08/02/2017   Obstructive sleep apnea 06/30/2017   Insomnia 06/30/2017   Coronary artery disease involving native coronary artery of native heart without angina pectoris 12/12/2015   Mixed hyperlipidemia 12/12/2015   Acquired hypothyroidism 03/17/2014   Gout 03/17/2014   Mild  persistent asthma without complication 03/17/2014   Epiretinal membrane 07/27/2012   Open angle with borderline findings, low risk 07/27/2012   Overactive bladder 08/12/2011   Pap smear, as part of routine gynecological examination 08/12/2011   RLS (restless legs syndrome) 08/12/2011   Visit for gynecologic examination 08/12/2011   OA (osteoarthritis) 07/18/2011   Cellulitis of right leg 07/13/2011   Right leg pain 07/08/2011   Ruptured Bakers cyst 07/08/2011   Left bundle-branch block, unspecified 07/22/2003    ONSET DATE: Pt cannot recall when she started falling or started feeling off balance.  REFERRING DIAG: R41.9 (ICD-10-CM) - Cognitive complaints with normal exam R26.9 (ICD-10-CM) - Gait abnormality R29.6 (ICD-10-CM) - Multiple falls  THERAPY DIAG:  Other abnormalities of gait and mobility  Muscle weakness (generalized)  Unsteadiness on feet  Repeated falls  Rationale for Evaluation and Treatment: Rehabilitation  SUBJECTIVE:  SUBJECTIVE STATEMENT: Patient reports she is falling when not using her 2WW.  She reports feeling generally off balance and very stiff first thing in the mornings.  She is primarily using 2WW in community to prevent falls. Pt states "I have an overprotective husband and when you have an overprotective husband you don't pay attention because you figure they will."  She fell on 2/20 between 1125-1145 (pt made a note on her phone), she slid down when getting out of bed hitting her head on floor (PT assessed head visually w/o bruising or abrasion, pt mildly TTP over superolateral posterior scalp w/o redness, warmth or visible swelling.  She denies headaches or dizziness since fall.  Was informed to seek emergency assessment should sudden symptoms arise.)  Pt proceeds to  recount her life travels and work experience timeline - PT unable to redirect.  Pt accompanied by: significant other - Husband [name] (drives her)  PERTINENT HISTORY:  OSA, Asthma w/ COPD, hypothyroidism, CAD, Gout, DM2, left bundle branch block  PAIN:  Are you having pain? No  PRECAUTIONS: Fall  RED FLAGS: None   WEIGHT BEARING RESTRICTIONS: No  FALLS: Has patient fallen in last 6 months? Yes. Number of falls pt unsure as she "sometimes falls" usually in home w/o 2WW  LIVING ENVIRONMENT: Lives with: lives with their spouse Lives in: House/apartment Stairs:  pt does not enter second level of home and states "no need" Has following equipment at home: Single point cane and Walker - 2 wheeled  PLOF: Independent and Requires assistive device for independence  PATIENT GOALS: "probably more stability, but I didn't even know I was needing therapy"  OBJECTIVE:  Note: Objective measures were completed at Evaluation unless otherwise noted.  DIAGNOSTIC FINDINGS: MRI Head ordered - pt has not scheduled yet as she is missing the calls and cannot reach them when she returns the call later  COGNITION: Overall cognitive status: History of cognitive impairments - at baseline and No family/caregiver present to determine baseline cognitive functioning   SENSATION: Light touch: WFL - pt reports neuropathy w/o noted issues at home with hot/cold water or burning in the feet  COORDINATION: LE RAMS:  slow and deliberate Bilateral Heel-to-shin:  WNL  EDEMA:  Moderate non-pitting edema from toes to knee line bilaterally - left calf appearing mildly larger than right; edu on not leaving socks on overnight to prevent wound developing from excessive pressure at top of sock cuff (she has severe indentation at top of mid-calf socks), encouraged higher compression garment to help with fluid management  MUSCLE TONE: none noted in BLE  POSTURE: rounded shoulders, forward head, increased thoracic kyphosis,  and weight shift left  LOWER EXTREMITY ROM:     Active  Right Eval Left Eval  Hip flexion [facility]  Hip extension    Hip abduction " "  Hip adduction " "  Hip internal rotation    Hip external rotation    Knee flexion " "  Knee extension " "  Ankle dorsiflexion " "  Ankle plantarflexion    Ankle inversion    Ankle eversion     (Blank rows = not tested)  LOWER EXTREMITY MMT:    MMT Right Eval Left Eval  Hip flexion 3+/5 3+/5  Hip extension    Hip abduction 3+/5 3+/5  Hip adduction 3/5 3/5  Hip internal rotation    Hip external rotation    Knee flexion 3/5 3/5  Knee extension 4-/5 4-/5  Ankle dorsiflexion 3/5 3/5  Ankle  plantarflexion    Ankle inversion    Ankle eversion    (Blank rows = not tested)  BED MOBILITY:  Pt reports she only needs her 2WW to get into and out of bed due to height of bed (her feet dangle so she uses this to lower to floor and boost up)  TRANSFERS: Assistive device utilized: Environmental consultant - 2 wheeled  Sit to stand: SBA Stand to sit: SBA Chair to chair: SBA Floor: Modified independence and typically her husband will do it for her if he hears her fall, otherwise she just needs something to push up from  GAIT: Gait pattern: step through pattern, decreased step length- Right, decreased step length- Left, decreased stride length, decreased hip/knee flexion- Right, decreased hip/knee flexion- Left, decreased ankle dorsiflexion- Right, decreased ankle dorsiflexion- Left, shuffling, decreased trunk rotation, trunk flexed, and narrow BOS Distance walked: various clinic distances Assistive device utilized: Environmental consultant - 2 wheeled and None Level of assistance: SBA and CGA Comments: Pt abandons walker in lobby to retrieve purse from husband resulting in a wobbly, shuffling gait.  PT provides light CGA due to poor foot clearance for short distance.  FUNCTIONAL TESTS:  5 times sit to stand: 14.26 sec w/ BUE on 2WW - discussed safety w/ walker and pt demos  staggered hand support stating "It's not that I don't know what to do, but if I'm bored or choosing not to pay attention I just don't do it!"   Timed up and go (TUG): To be assessed. 10 meter walk test: To be assessed. Berg Balance Scale: To be assessed.  PATIENT SURVEYS:  ABC not completed as pt states "I am not afraid of falling!"                                                                                                                              TREATMENT DATE: 03/21/2023 - see education throughout session and frequent redirection to task.    PATIENT EDUCATION: Education details: PT POC, assessments used and to be used, and goals to be set. Person educated: Patient Education method: Explanation Education comprehension: verbalized understanding and needs further education  HOME EXERCISE PROGRAM: To be established.  GOALS: Goals reviewed with patient? Yes  SHORT TERM GOALS: Target date: 04/18/2023  Pt will be independent and compliant with initial strength and balance focused HEP in order to maintain functional progress and improve mobility. Baseline:  To be established. Goal status: INITIAL  2.  Pt will decrease 5xSTS to </=12 seconds w/ proper hand placement in order to demonstrate decreased risk for falls and improved functional bilateral LE strength and power. Baseline: 14.26 sec w/ BUE Goal status: INITIAL  3.  Pt and husband will verbalize understanding of fall prevention strategies. Baseline: To be provided. Goal status: INITIAL  4.  TUG to be assessed w/ goal set as appropriate. Baseline: To be assessed. Goal status: INITIAL  5.  to be assessed w/ goal set as  appropriate. Baseline: To be assessed. Goal status: INITIAL  6.  BERG to be assessed w/ goal set as appropriate. Baseline: To be assessed. Goal status: INITIAL  LONG TERM GOALS: Target date: 05/16/2023  Pt will be independent and compliant with finalized strength and balance focused HEP in  order to maintain functional progress and improve mobility. Baseline: To be established. Goal status: INITIAL  2.  Pt and husband will report compliance to pt using 2WW 100% of the time to reduce fall risk. Baseline: Pt only using it if she leaves the house sometimes - all falls occurring without use of 2WW Goal status: INITIAL  3.  Pt will demonstrate fall recovery at no more than minA to improve management in home environment. Baseline: pt reports mod I Goal status: INITIAL  4.  TUG to be assessed w/ goal set as appropriate. Baseline:  Goal status: INITIAL  5.  to be assessed w/ goal set as appropriate. Baseline: To be assessed. Goal status: INITIAL  6.  BERG to be assessed w/ goal set as appropriate. Baseline: To be assessed. Goal status: INITIAL  ASSESSMENT:  CLINICAL IMPRESSION: Patient is a 78 y.o. female who was seen today for physical therapy evaluation and treatment for gait abnormalities and imbalance.  Pt has a significant PMH of OSA, Asthma w/ COPD, hypothyroidism, CAD, Gout, DM2, and left bundle branch block.  Identified impairments include cognitive decline, improper AD use, frequent falls w/o device, moderate BLE weakness, severe gait deficits, impaired distal coordination, lack of insight into deficits and safety awareness, and chronic postural changes.  Evaluation via the following assessment tools: 5xSTS indicates elevated fall risk.  Will further assess fall risk and specific deficits using , TUG, and BERG next session.  She would benefit from skilled PT to address impairments as noted and progress towards long term goals.  OBJECTIVE IMPAIRMENTS: Abnormal gait, decreased activity tolerance, decreased balance, decreased cognition, decreased coordination, decreased endurance, decreased knowledge of condition, decreased knowledge of use of DME, decreased mobility, difficulty walking, decreased strength, decreased safety awareness, increased edema, impaired  sensation, improper body mechanics, and postural dysfunction.   ACTIVITY LIMITATIONS: carrying, lifting, bending, standing, squatting, stairs, transfers, reach over head, and locomotion level  PARTICIPATION LIMITATIONS: driving and community activity  PERSONAL FACTORS: Age, Fitness, Time since onset of injury/illness/exacerbation, and 1-2 comorbidities: neuropathy and cognitive changes  are also affecting patient's functional outcome.   REHAB POTENTIAL: Good  CLINICAL DECISION MAKING: Evolving/moderate complexity  EVALUATION COMPLEXITY: Moderate  PLAN:  PT FREQUENCY: 2x/week   PT DURATION: 8 weeks  PLANNED INTERVENTIONS: 97164- PT Re-evaluation, 97110-Therapeutic exercises, 97530- Therapeutic activity, 97112- Neuromuscular re-education, 97535- Self Care, 16109- Manual therapy, [phone]- Gait training, [phone]- Orthotic Fit/training, Patient/Family education, Balance training, Stair training, Vestibular training, and DME instructions  PLAN FOR NEXT SESSION: ASSESS BERG, , and TUG - set goals as appropriate.  Initiate strength and balance HEP.  Review fall prevention strategies.  Floor recovery.  2WW should be used at all times.  Safe STS technique.  Obstacle management.  Walking program with husband's supervision in home likely?    Sadie Haber, PT, DPT 03/21/2023, 2:46 PM

## 2023-03-26 ENCOUNTER — Ambulatory Visit: Payer: Medicare Other | Attending: Neurology | Admitting: Physical Therapy

## 2023-03-26 ENCOUNTER — Encounter: Payer: Self-pay | Admitting: Physical Therapy

## 2023-03-26 VITALS — BP 115/68 | HR 64

## 2023-03-26 DIAGNOSIS — R2689 Other abnormalities of gait and mobility: Secondary | ICD-10-CM | POA: Diagnosis present

## 2023-03-26 DIAGNOSIS — M6281 Muscle weakness (generalized): Secondary | ICD-10-CM | POA: Insufficient documentation

## 2023-03-26 DIAGNOSIS — R296 Repeated falls: Secondary | ICD-10-CM | POA: Insufficient documentation

## 2023-03-26 DIAGNOSIS — R2681 Unsteadiness on feet: Secondary | ICD-10-CM | POA: Diagnosis present

## 2023-03-26 NOTE — Therapy (Signed)
 OUTPATIENT PHYSICAL THERAPY NEURO TREATMENT   Patient Name: Diana [name] MRN: [id number] DOB:[birth year], 78 y.o., female Today's Date: 03/26/2023   PCP: Avel Peace Chales Salmon, MD REFERRING PROVIDER: Windell Norfolk, MD  END OF SESSION:  PT End of Session - 03/26/23 1111     Visit Number 2    Number of Visits 17    Date for PT Re-Evaluation 05/23/23    Authorization Type Medicare A&B/BCBS/Aetna Supplement 2025    Progress Note Due on Visit 10    PT Start Time 1105    PT Stop Time 1145    PT Time Calculation (min) 40 min    Equipment Utilized During Treatment Gait belt    Activity Tolerance Patient tolerated treatment well    Behavior During Therapy WFL for tasks assessed/performed             Past Medical History:  Diagnosis Date   Allergic rhinitis    Asthma    COPD (chronic obstructive pulmonary disease) (HCC)    Gout    Left bundle branch block    Sleep apnea    Umbilical hernia    Past Surgical History:  Procedure Laterality Date   ADENOIDECTOMY     BREAST LUMPECTOMY Right    CARDIAC CATHETERIZATION     CATARACT EXTRACTION, BILATERAL     ROOT CANAL     SKIN BIOPSY     TONSILLECTOMY     WISDOM TOOTH EXTRACTION     Patient Active Problem List   Diagnosis Date Noted   Peripheral edema 12/11/2022   Rhinitis, non-allergic 11/07/2019   Burn 11/10/2018   Severe obesity (BMI 35.0-39.9) with comorbidity (HCC) 12/15/2017   Asthma with COPD (HCC) 10/22/2017   Type 2 diabetes mellitus without complication, without long-term current use of insulin (HCC) 08/02/2017   Obstructive sleep apnea 06/30/2017   Insomnia 06/30/2017   Coronary artery disease involving native coronary artery of native heart without angina pectoris 12/12/2015   Mixed hyperlipidemia 12/12/2015   Acquired hypothyroidism 03/17/2014   Gout 03/17/2014   Mild persistent asthma without complication 03/17/2014   Epiretinal membrane 07/27/2012   Open angle with borderline findings, low risk 07/27/2012    Overactive bladder 08/12/2011   Pap smear, as part of routine gynecological examination 08/12/2011   RLS (restless legs syndrome) 08/12/2011   Visit for gynecologic examination 08/12/2011   OA (osteoarthritis) 07/18/2011   Cellulitis of right leg 07/13/2011   Right leg pain 07/08/2011   Ruptured Bakers cyst 07/08/2011   Left bundle-branch block, unspecified 07/22/2003    ONSET DATE: Pt cannot recall when she started falling or started feeling off balance.  REFERRING DIAG: R41.9 (ICD-10-CM) - Cognitive complaints with normal exam R26.9 (ICD-10-CM) - Gait abnormality R29.6 (ICD-10-CM) - Multiple falls  THERAPY DIAG:  Other abnormalities of gait and mobility  Muscle weakness (generalized)  Repeated falls  Unsteadiness on feet  Rationale for Evaluation and Treatment: Rehabilitation  SUBJECTIVE:  SUBJECTIVE STATEMENT: Patient denies falls and near falls since last here. Reports that she is using her 2WW almost all the time.   Pt accompanied by: significant other - Husband [name] (drives her)  PERTINENT HISTORY:  OSA, Asthma w/ COPD, hypothyroidism, CAD, Gout, DM2, left bundle branch block  PAIN:  Are you having pain? No  PRECAUTIONS: Fall  RED FLAGS: None   WEIGHT BEARING RESTRICTIONS: No  FALLS: Has patient fallen in last 6 months? Yes. Number of falls pt unsure as she "sometimes falls" usually in home w/o 2WW  LIVING ENVIRONMENT: Lives with: lives with their spouse Lives in: House/apartment Stairs:  pt does not enter second level of home and states "no need" Has following equipment at home: Single point cane and Walker - 2 wheeled  PLOF: Independent and Requires assistive device for independence  PATIENT GOALS: "probably more stability, but I didn't even know I was needing  therapy"  OBJECTIVE:  Note: Objective measures were completed at Evaluation unless otherwise noted.  DIAGNOSTIC FINDINGS: MRI Head ordered - pt has not scheduled yet as she is missing the calls and cannot reach them when she returns the call later  COGNITION: Overall cognitive status: History of cognitive impairments - at baseline and No family/caregiver present to determine baseline cognitive functioning   SENSATION: Light touch: WFL - pt reports neuropathy w/o noted issues at home with hot/cold water or burning in the feet                                                                                                                TREATMENT:   Self Care: Vitals:   03/26/23 1115 03/26/23 1141  BP: (!) 120/53 115/68  Pulse: 69 64    Low diastolic but improved over course of session and patient asymptomatic  Educated on falls risk due to scores on test  TherAct:  1 lap walking with 2WW to monitor SPO2 as original reading very low in 60-70s, appears to be pulse oxy error as patient SPO2 monitored throughout session with very steady readings of 96-100% with all activity    [facility] PT Assessment - 03/26/23 0001       Standardized Balance Assessment   Standardized Balance Assessment 10 meter walk test;Timed Up and Go Test;Berg Balance Test    10 Meter Walk 0.55 m/s   with 2WW (SBA)     Berg Balance Test   Sit to Stand Able to stand  independently using hands    Standing Unsupported Able to stand safely 2 minutes    Sitting with Back Unsupported but Feet Supported on Floor or Stool Able to sit safely and securely 2 minutes    Stand to Sit Uses backs of legs against chair to control descent    Transfers Able to transfer with verbal cueing and /or supervision    Standing Unsupported with Eyes Closed Able to stand 10 seconds safely    Standing Unsupported with Feet Together Able to place feet together independently and stand 1 minute safely  From Standing, Reach Forward with  Outstretched Arm Reaches forward but needs supervision    From Standing Position, Pick up Object from Floor Unable to pick up and needs supervision    From Standing Position, Turn to Look Behind Over each Shoulder Looks behind one side only/other side shows less weight shift    Turn 360 Degrees Able to turn 360 degrees safely one side only in 4 seconds or less    Standing Unsupported, Alternately Place Feet on Step/Stool Able to complete >2 steps/needs minimal assist   due to poor stability with stance on L leg, able to balance better on R leg   Standing Unsupported, One Foot in Front Able to take small step independently and hold 30 seconds    Standing on One Leg Tries to lift leg/unable to hold 3 seconds but remains standing independently    Total Score 35    Berg comment: 35/56 = increased risk for falls      Timed Up and Go Test   Normal TUG (seconds) 24.62   seconds SBA with 2WW (SBA)            PATIENT EDUCATION: Education details: Goal results  Person educated: Patient Education method: Explanation Education comprehension: verbalized understanding and needs further education  HOME EXERCISE PROGRAM: To be established.  GOALS: Goals reviewed with patient? Yes  SHORT TERM GOALS: Target date: 04/18/2023  Pt will be independent and compliant with initial strength and balance focused HEP in order to maintain functional progress and improve mobility. Baseline:  To be established. Goal status: INITIAL  2.  Pt will decrease 5xSTS to </=12 seconds w/ proper hand placement in order to demonstrate decreased risk for falls and improved functional bilateral LE strength and power. Baseline: 14.26 sec w/ BUE Goal status: INITIAL  3.  Pt and husband will verbalize understanding of fall prevention strategies. Baseline: To be provided. Goal status: INITIAL  4.  Patient will improve TUG score to 20 seconds to indicate clinically significant progress towards a decreased risk of falls and  improved mobility.  Baseline: 24.62 seconds with 2WW Goal status: INITIAL  5.  Patient will improve gait speed to 0.65 m/s to indicate improvement towards the level of community ambulator in order to participate more easily in activities outside of the home.   Baseline: 0.55 m/s with 2WW Goal status: INITIAL  6.  BERG to be assessed w/ goal set as appropriate. Baseline: 35/56 Goal status: Discontinued STG for LTG only  LONG TERM GOALS: Target date: 05/16/2023  Pt will be independent and compliant with finalized strength and balance focused HEP in order to maintain functional progress and improve mobility. Baseline: To be established. Goal status: INITIAL  2.  Pt and husband will report compliance to pt using 2WW 100% of the time to reduce fall risk. Baseline: Pt only using it if she leaves the house sometimes - all falls occurring without use of 2WW Goal status: INITIAL  3.  Pt will demonstrate fall recovery at no more than minA to improve management in home environment. Baseline: pt reports mod I Goal status: INITIAL  4.  Patient will improve TUG score to 18 seconds to indicate clinically significant progress towards a decreased risk of falls and improved mobility.  Baseline: 24.62 seconds with 2WW Goal status: INITIAL  5.  Patient will improve gait speed to 0.75 m/s to indicate improvement towards the level of community ambulator in order to participate more easily in activities outside of the home.  Baseline: 0.55 m/s with 2WW Goal status: INITIAL  6.  Patient will improve Berg Balance score to 45/56 or greater to indicate a decreased risk of falls and improved static stability.   Baseline: 35/56 Goal status: INITIAL  ASSESSMENT:  CLINICAL IMPRESSION: Emphasis on skilled physical therapy session on patient's scores on testing; patient at an increased risk for falls based on Berg, TUG, and gait speed. Closely monitored SPO2 as pulse oxy with one very low reading but appears  to be error in device as was unable to reproduce over entirity of session with extensive walking and exercise. Continue POC with plans to establish HEP next session.   OBJECTIVE IMPAIRMENTS: Abnormal gait, decreased activity tolerance, decreased balance, decreased cognition, decreased coordination, decreased endurance, decreased knowledge of condition, decreased knowledge of use of DME, decreased mobility, difficulty walking, decreased strength, decreased safety awareness, increased edema, impaired sensation, improper body mechanics, and postural dysfunction.   ACTIVITY LIMITATIONS: carrying, lifting, bending, standing, squatting, stairs, transfers, reach over head, and locomotion level  PARTICIPATION LIMITATIONS: driving and community activity  PERSONAL FACTORS: Age, Fitness, Time since onset of injury/illness/exacerbation, and 1-2 comorbidities: neuropathy and cognitive changes  are also affecting patient's functional outcome.   REHAB POTENTIAL: Good  CLINICAL DECISION MAKING: Evolving/moderate complexity  EVALUATION COMPLEXITY: Moderate  PLAN:  PT FREQUENCY: 2x/week   PT DURATION: 8 weeks  PLANNED INTERVENTIONS: 97164- PT Re-evaluation, 97110-Therapeutic exercises, 97530- Therapeutic activity, 97112- Neuromuscular re-education, 97535- Self Care, 62952- Manual therapy, [phone]- Gait training, [phone]- Orthotic Fit/training, Patient/Family education, Balance training, Stair training, Vestibular training, and DME instructions  PLAN FOR NEXT SESSION: monitor SPO2  Initiate strength and balance HEP.  Review fall prevention strategies.  Floor recovery.  2WW should be used at all times.  Safe STS technique.  Obstacle management.  Walking program with husband's supervision in home likely?    Carmelia Bake, PT, DPT 03/26/2023, 12:37 PM

## 2023-03-28 ENCOUNTER — Encounter: Payer: Self-pay | Admitting: Physical Therapy

## 2023-03-28 ENCOUNTER — Ambulatory Visit: Payer: Medicare Other | Admitting: Physical Therapy

## 2023-03-28 VITALS — BP 134/51 | HR 65

## 2023-03-28 DIAGNOSIS — R2689 Other abnormalities of gait and mobility: Secondary | ICD-10-CM

## 2023-03-28 DIAGNOSIS — M6281 Muscle weakness (generalized): Secondary | ICD-10-CM

## 2023-03-28 DIAGNOSIS — R296 Repeated falls: Secondary | ICD-10-CM

## 2023-03-28 DIAGNOSIS — R2681 Unsteadiness on feet: Secondary | ICD-10-CM

## 2023-03-28 NOTE — Patient Instructions (Addendum)
 Access Code: [email or web address] URL: [email or web address] Date: 03/28/2023 Prepared by: Camille Bal  Exercises - Standing March with Counter Support  - 1 x daily - 3-4 x weekly - 3 sets - 10 reps - Mini Squat with Counter Support  - 1 x daily - 3-4 x weekly - 2 sets - 10 reps - Standing Tandem Balance with Counter Support  - 1 x daily - 3-4 x weekly - 1 sets - 4 reps - 30 seconds hold  Patient Education - How to Prevent Falls  You Can Walk For A Certain Length Of Time Each Day (PLEASE USE THE WALKER and have husband present for supervision)                          Walk 2 minutes 3-4 times per day.
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
## 2023-04-02 ENCOUNTER — Encounter: Payer: Self-pay | Admitting: Physical Therapy

## 2023-04-02 ENCOUNTER — Ambulatory Visit: Payer: Medicare Other | Admitting: Physical Therapy

## 2023-04-02 VITALS — BP 111/63 | HR 69

## 2023-04-02 DIAGNOSIS — R296 Repeated falls: Secondary | ICD-10-CM

## 2023-04-02 DIAGNOSIS — M6281 Muscle weakness (generalized): Secondary | ICD-10-CM

## 2023-04-02 DIAGNOSIS — R2681 Unsteadiness on feet: Secondary | ICD-10-CM

## 2023-04-02 DIAGNOSIS — R2689 Other abnormalities of gait and mobility: Secondary | ICD-10-CM
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
## 2023-04-04 ENCOUNTER — Encounter: Payer: Self-pay | Admitting: Physical Therapy

## 2023-04-04 ENCOUNTER — Ambulatory Visit
Admission: RE | Admit: 2023-04-04 | Discharge: 2023-04-04 | Disposition: A | Discharge status: Home / Self Care | Source: Ambulatory Visit | Attending: Neurology | Admitting: Neurology

## 2023-04-04 ENCOUNTER — Ambulatory Visit: Payer: Medicare Other | Admitting: Physical Therapy

## 2023-04-04 VITALS — HR 57

## 2023-04-04 DIAGNOSIS — R2689 Other abnormalities of gait and mobility: Secondary | ICD-10-CM | POA: Diagnosis not present

## 2023-04-04 DIAGNOSIS — R419 Unspecified symptoms and signs involving cognitive functions and awareness: Secondary | ICD-10-CM

## 2023-04-04 DIAGNOSIS — R269 Unspecified abnormalities of gait and mobility: Secondary | ICD-10-CM

## 2023-04-04 DIAGNOSIS — R296 Repeated falls: Secondary | ICD-10-CM

## 2023-04-04 DIAGNOSIS — M6281 Muscle weakness (generalized): Secondary | ICD-10-CM

## 2023-04-04 DIAGNOSIS — R2681 Unsteadiness on feet: Secondary | ICD-10-CM

## 2023-04-04 NOTE — Therapy (Signed)
 OUTPATIENT PHYSICAL THERAPY NEURO TREATMENT   Patient Name: Diana [name] MRN: [id number] DOB:07-30-1945, 78 y.o., female Today's Date: 04/04/2023   PCP: Avel Peace Chales Salmon, MD REFERRING PROVIDER: Windell Norfolk, MD  END OF SESSION:  PT End of Session - 04/04/23 1326     Visit Number 5    Number of Visits 17    Date for PT Re-Evaluation 05/23/23    Authorization Type Medicare A&B/BCBS/[employer] Supplement 2025    Progress Note Due on Visit 10    PT Start Time 1321    PT Stop Time 1400    PT Time Calculation (min) 39 min    Equipment Utilized During Treatment Gait belt    Activity Tolerance Patient tolerated treatment well;Patient limited by fatigue    Behavior During Therapy WFL for tasks assessed/performed             Past Medical History:  Diagnosis Date   Allergic rhinitis    Asthma    COPD (chronic obstructive pulmonary disease) (HCC)    Gout    Left bundle branch block    Sleep apnea    Umbilical hernia    Past Surgical History:  Procedure Laterality Date   ADENOIDECTOMY     BREAST LUMPECTOMY Right    CARDIAC CATHETERIZATION     CATARACT EXTRACTION, BILATERAL     ROOT CANAL     SKIN BIOPSY     TONSILLECTOMY     WISDOM TOOTH EXTRACTION     Patient Active Problem List   Diagnosis Date Noted   Peripheral edema 12/11/2022   Rhinitis, non-allergic 11/07/2019   Burn 11/10/2018   Severe obesity (BMI 35.0-39.9) with comorbidity (HCC) 12/15/2017   Asthma with COPD (HCC) 10/22/2017   Type 2 diabetes mellitus without complication, without long-term current use of insulin (HCC) 08/02/2017   Obstructive sleep apnea 06/30/2017   Insomnia 06/30/2017   Coronary artery disease involving native coronary artery of native heart without angina pectoris 12/12/2015   Mixed hyperlipidemia 12/12/2015   Acquired hypothyroidism 03/17/2014   Gout 03/17/2014   Mild persistent asthma without complication 03/17/2014   Epiretinal membrane 07/27/2012   Open angle with borderline  findings, low risk 07/27/2012   Overactive bladder 08/12/2011   Pap smear, as part of routine gynecological examination 08/12/2011   RLS (restless legs syndrome) 08/12/2011   Visit for gynecologic examination 08/12/2011   OA (osteoarthritis) 07/18/2011   Cellulitis of right leg 07/13/2011   Right leg pain 07/08/2011   Ruptured Bakers cyst 07/08/2011   Left bundle-branch block, unspecified 07/22/2003    ONSET DATE: Pt cannot recall when she started falling or started feeling off balance.  REFERRING DIAG: R41.9 (ICD-10-CM) - Cognitive complaints with normal exam R26.9 (ICD-10-CM) - Gait abnormality R29.6 (ICD-10-CM) - Multiple falls  THERAPY DIAG:  Other abnormalities of gait and mobility  Muscle weakness (generalized)  Repeated falls  Unsteadiness on feet  Rationale for Evaluation and Treatment: Rehabilitation  SUBJECTIVE:  SUBJECTIVE STATEMENT: Patient denies falls and near falls since last here.  No pain today.  She states she has been busy with errands and appts since early this morning and because of this she forgot her 2WW at home.  She has worked on the [employer] little bit, but not very good."  Pt accompanied by: significant other - Husband [name] (drives her)  PERTINENT HISTORY:  OSA, Asthma w/ COPD, hypothyroidism, CAD, Gout, DM2, left bundle branch block  PAIN:  Are you having pain? No  PRECAUTIONS: Fall  RED FLAGS: None   WEIGHT BEARING RESTRICTIONS: No  FALLS: Has patient fallen in last 6 months? Yes. Number of falls pt unsure as she "sometimes falls" usually in home w/o 2WW  LIVING ENVIRONMENT: Lives with: lives with their spouse Lives in: House/apartment Stairs:  pt does not enter second level of home and states "no need" Has following equipment at home: Single point cane  and Walker - 2 wheeled  PLOF: Independent and Requires assistive device for independence  PATIENT GOALS: "probably more stability, but I didn't even know I was needing therapy"  OBJECTIVE:  Note: Objective measures were completed at Evaluation unless otherwise noted.  DIAGNOSTIC FINDINGS: MRI Head ordered - pt has not scheduled yet as she is missing the calls and cannot reach them when she returns the call later  COGNITION: Overall cognitive status: History of cognitive impairments - at baseline and No family/caregiver present to determine baseline cognitive functioning   SENSATION: Light touch: WFL - pt reports neuropathy w/o noted issues at home with hot/cold water or burning in the feet                                                                                                                TREATMENT:   Self Care: Vitals:   04/04/23 1325  Pulse: (!) 57  SpO2: 98%   PT provided clinic 2WW to enter and exit building/gym for safety - handoff to husband for HHA in lobby per pt preference.    Pt inquires about why this walker moves smoother than hers - discussed buying tennis balls vs current plastic gliders (PT can cut for patient if she brings them - she states she will think about this).  Provided initial tennis balls and explained how to switch them out.  -SciFit x3 minutes 49 seconds w/ 1 minute rest followed by 4 minutes using BUE/BLE for global strength and endurance progressing to level 3.0.  RPE 8/10 at end of task.  Pt requires prolonged seated rest due to fatigue.  -Midline cone taps > crossbody cone taps -Forward marching 4x8 ft w/ BUE support > backwards walking 4x8 ft w/ BUE support  PATIENT EDUCATION: Education details: 24/7 use of 2WW for fall prevention.   Person educated: Patient Education method: Explanation Education comprehension: verbalized understanding and needs further education  HOME EXERCISE PROGRAM: Access Code: [email or web address] URL:  [email or web address] Date: 03/28/2023 Prepared by: Camille Bal  Exercises - Standing March with Counter Support  - 1 x daily -  3-4 x weekly - 3 sets - 10 reps - Mini Squat with Counter Support  - 1 x daily - 3-4 x weekly - 2 sets - 10 reps - Standing Tandem Balance with Counter Support  - 1 x daily - 3-4 x weekly - 1 sets - 4 reps - 30 seconds hold  Patient Education - How to Prevent Falls  You Can Walk For A Certain Length Of Time Each Day (PLEASE USE THE WALKER and have husband present for supervision)                          Walk 2 minutes 3-4 times per day.  GOALS: Goals reviewed with patient? Yes  SHORT TERM GOALS: Target date: 04/18/2023  Pt will be independent and compliant with initial strength and balance focused HEP in order to maintain functional progress and improve mobility. Baseline:  To be established. Goal status: INITIAL  2.  Pt will decrease 5xSTS to </=12 seconds w/ proper hand placement in order to demonstrate decreased risk for falls and improved functional bilateral LE strength and power. Baseline: 14.26 sec w/ BUE Goal status: INITIAL  3.  Pt and husband will verbalize understanding of fall prevention strategies. Baseline: To be provided. Goal status: INITIAL  4.  Patient will improve TUG score to 20 seconds to indicate clinically significant progress towards a decreased risk of falls and improved mobility.  Baseline: 24.62 seconds with 2WW Goal status: INITIAL  5.  Patient will improve gait speed to 0.65 m/s to indicate improvement towards the level of community ambulator in order to participate more easily in activities outside of the home.   Baseline: 0.55 m/s with 2WW Goal status: INITIAL  6.  BERG to be assessed w/ goal set as appropriate. Baseline: 35/56 Goal status: Discontinued STG for LTG only  LONG TERM GOALS: Target date: 05/16/2023  Pt will be independent and compliant with finalized strength and balance focused HEP  in order to maintain functional progress and improve mobility. Baseline: To be established. Goal status: INITIAL  2.  Pt and husband will report compliance to pt using 2WW 100% of the time to reduce fall risk. Baseline: Pt only using it if she leaves the house sometimes - all falls occurring without use of 2WW Goal status: INITIAL  3.  Pt will demonstrate fall recovery at no more than minA to improve management in home environment. Baseline: pt reports mod I Goal status: INITIAL  4.  Patient will improve TUG score to 18 seconds to indicate clinically significant progress towards a decreased risk of falls and improved mobility.  Baseline: 24.62 seconds with 2WW Goal status: INITIAL  5.  Patient will improve gait speed to 0.75 m/s to indicate improvement towards the level of community ambulator in order to participate more easily in activities outside of the home.   Baseline: 0.55 m/s with 2WW Goal status: INITIAL  6.  Patient will improve Berg Balance score to 45/56 or greater to indicate a decreased risk of falls and improved static stability.   Baseline: 35/56 Goal status: INITIAL  ASSESSMENT:  CLINICAL IMPRESSION: Patient fatigues quickly and requires significant amount of encouragement to engage to tasks this visit.  She was encouraged to continue using 2WW for balance and also due to significant fatigue with light activity.  PT will continue to address endurance and other balance deficits as able.  Continue per POC.  OBJECTIVE IMPAIRMENTS: Abnormal gait, decreased activity tolerance, decreased balance, decreased  cognition, decreased coordination, decreased endurance, decreased knowledge of condition, decreased knowledge of use of DME, decreased mobility, difficulty walking, decreased strength, decreased safety awareness, increased edema, impaired sensation, improper body mechanics, and postural dysfunction.   ACTIVITY LIMITATIONS: carrying, lifting, bending, standing, squatting,  stairs, transfers, reach over head, and locomotion level  PARTICIPATION LIMITATIONS: driving and community activity  PERSONAL FACTORS: Age, Fitness, Time since onset of injury/illness/exacerbation, and 1-2 comorbidities: neuropathy and cognitive changes  are also affecting patient's functional outcome.   REHAB POTENTIAL: Good  CLINICAL DECISION MAKING: Evolving/moderate complexity  EVALUATION COMPLEXITY: Moderate  PLAN:  PT FREQUENCY: 2x/week   PT DURATION: 8 weeks  PLANNED INTERVENTIONS: 97164- PT Re-evaluation, 97110-Therapeutic exercises, 97530- Therapeutic activity, 97112- Neuromuscular re-education, 97535- Self Care, 16109- Manual therapy, [id number]- Gait training, [phone]- Orthotic Fit/training, Patient/Family education, Balance training, Stair training, Vestibular training, and DME instructions  PLAN FOR NEXT SESSION: monitor SPO2  Add to strength and balance HEP - is she doing?  Floor recovery.  Safe STS technique.  Obstacle management.  Ongoing edu on 24/7 use of 2WW.  SLS.  Hip strengthening.  Sadie Haber, PT, DPT 04/04/2023, 2:02 PM

## 2023-04-06 NOTE — Progress Notes (Addendum)
 HPI-   female never smoker followed for OSA complicated by asthma/COPD (Dr. Marchelle Gearing), allergic rhinitis (Dr. [place] Callas), gout NPSG 07/21/17-AHI 15.2/hour, desaturation to 84%, body weight 210 pounds PFT 08/08/2017-mild obstruction with significant response to bronchodilator, mild diffusion reduction.  FVC 1.85/72%, FEV1 1.39/73%, ratio 0.75, TLC 86%, DLCO 72%  -----------------------------------------------------------------------------------------   11/14/22- 78 year old female never smoker followed for OSA  asthma/COPD , complicated by  rhinitis (Dr. Lyndon Callas), gout, DM2, Macular Degeneration, CAD, Obesity, Hypothyroid,  CPAP auto 5-20/ [facility]    Night Guard Mouthpiece for bruxism -Symbicort 160, Flonase, Uniphyl 400, Singulair, Spiriva 1.25, Ventolin hfa, neb Duoneb, Temazepam,  Download-compliance  Body weight today-135 lbs  (at her 2019 NPSG she weighted 210 lbs) -----OSA on cpap, Breathing has been good She has dieted off about 75 lbs since her sleep study. Not using CPAP and thinks she is sleeping ok. Occasional nap. Notes leg edema L>R, x 3-4 months. PCP gave lasix which has helped some. She can't tell if theophylline helps now. No recent respiratory exacerbation. UTD flu and covid vax.  04/08/23-78 year old female never smoker followed for OSA  asthma/COPD , complicated by  rhinitis (Dr. [place] Callas), gout, DM2, Macular Degeneration, CAD, Hypothyroid,  CPAP auto 5-20/ [facility]    Night Guard Mouthpiece for bruxism -Symbicort 160, Flonase, Uniphyl 400, Singulair, Spiriva 1.25, Ventolin hfa, neb Duoneb, Temazepam,  Download-compliance N/A Body weight today-    141 lbs                                          (at her 2019 NPSG she weighted 210 lbs) -----Doing okay.  Has episodes of coughing with a lot of mucus production On augmentin for sinusitis recently revealed during imaging for suspected CVA (ruled out) Rarely uses CPAP since weight loss. Discussed the use of AI scribe software for  clinical note transcription with the patient, who gave verbal consent to proceed. History of Present Illness   The patient, with a history of sleep apnea,  presents with a severe cold that she contracted about a month or six weeks ago. The patient's husband also contracted the same cold around the same time. The husband sought care at an urgent care center and was prescribed an antibiotic, which effectively treated his symptoms. However, she came down with the same bronchitis. She was prescribed Augmentin, starting today. The patient also reports intermittent coughing, which she attributes to the need to expel phlegm.  She has lost significant weight over the past few years, which has allowed her to stop using CPAP for her sleep apnea. She reports sleeping well, occasionally taking over-the-counter sleeping pills.     Her PCP is refilling her respiratory meds. He will be able to see her going forward, but we are happy to to see her if needed.  ROS-see HPI   + = positive Constitutional:    +weight loss, night sweats, fevers, chills, fatigue, lassitude. HEENT:    headaches, difficulty swallowing, tooth/dental problems, sore throat,       sneezing, itching, ear ache, nasal congestion, post nasal drip, snoring CV:    chest pain, orthopnea, PND, +swelling in lower extremities, anasarca,  dizziness, palpitations Resp:   shortness of breath with exertion or at rest.                +productive cough,   non-productive cough, coughing up of blood.              change in color of mucus.  wheezing.   Skin:    rash or lesions. GI:  No-   heartburn, indigestion, abdominal pain, nausea, vomiting, diarrhea,                 change in bowel habits, loss of appetite GU: dysuria, change in color of urine, no urgency or frequency.   flank pain. MS:   joint pain, stiffness, decreased range of motion, back pain. Neuro-     nothing unusual Psych:  change in mood or  affect.  depression or anxiety.   memory loss.  OBJ- Physical Exam General- Alert, Oriented, Affect-appropriate, Distress- none acute,  Skin- rash-none, lesions- none, excoriation- none Lymphadenopathy- none Head- atraumatic            Eyes- Gross vision intact, PERRLA, conjunctivae and secretions clear            Ears-+ bilateral hearing aids            Nose- Clear, no-Septal dev, mucus, polyps, erosion, perforation             Throat- Mallampati IV , mucosa clear , drainage- none, tonsils- atrophic Neck- flexible , trachea midline, no stridor , thyroid nl, carotid no bruit Chest - symmetrical excursion , unlabored           Heart/CV- RRR , no murmur , no gallop  , no rub, nl s1 s2                           - JVD- none , edema +trace, stasis changes- none, varices- none           Lung- clear to P&A, wheeze- none, cough- none , dullness-none, rub- none           Chest wall-  Abd-  Br/ Gen/ Rectal- Not done, not indicated Extrem- cyanosis- none, clubbing, none, atrophy- none, strength- nl Neuro- grossly intact to observation  Assessment and Plan:    Sinus infection Likely bacterial sinus infection. Augmentin prescribed for sinus and potential bacterial chest infection. Intermittent cough present, no persistent cough. - Continue Augmentin as prescribed. - Consider chest x-ray if cough persists.  Chronic respiratory management On Symbicort, Uniphyflil, Singulair, Spiriva, and albuterol. Managed by primary care physician. - Follow up with primary care physician for respiratory medication management.  Sleep apnea Significant weight loss led to discontinuation of CPAP. Reports sleeping well with occasional use of over-the-counter sleeping pills.  -We agreed we could see her again if needed  Allergies Improved since moving to [place]. Receives monthly allergy shots for cat allergies with fewer and less severe attacks. - Continue monthly allergy shots for cat  allergy.  Follow-up No need for continued follow-up with pulmonology unless further assistance is required. Primary care physician managing most of her care. - Return to pulmonology if needed. - Follow up with primary care physician for ongoing care.

## 2023-04-07 ENCOUNTER — Encounter: Payer: Self-pay | Admitting: Neurology

## 2023-04-08 ENCOUNTER — Ambulatory Visit: Payer: Medicare Other | Admitting: Internal Medicine

## 2023-04-08 ENCOUNTER — Encounter: Payer: Self-pay | Admitting: Internal Medicine

## 2023-04-08 VITALS — BP 120/58 | HR 78 | Temp 98.4°F | Ht 61.0 in | Wt 141.6 lb

## 2023-04-08 DIAGNOSIS — J019 Acute sinusitis, unspecified: Secondary | ICD-10-CM | POA: Diagnosis not present

## 2023-04-08 DIAGNOSIS — G4733 Obstructive sleep apnea (adult) (pediatric): Secondary | ICD-10-CM | POA: Diagnosis not present

## 2023-04-08 NOTE — Patient Instructions (Signed)
 We agreed that you can see your primary doctor from now on, if that is comfortable. We will be happy to see you again if we can help.

## 2023-04-09 ENCOUNTER — Ambulatory Visit: Payer: Medicare Other | Admitting: Physical Therapy

## 2023-04-09 ENCOUNTER — Telehealth: Payer: Self-pay | Admitting: Physical Therapy

## 2023-04-09 ENCOUNTER — Encounter: Payer: Self-pay | Admitting: Physical Therapy

## 2023-04-09 VITALS — BP 112/50 | HR 60

## 2023-04-09 DIAGNOSIS — R2689 Other abnormalities of gait and mobility: Secondary | ICD-10-CM

## 2023-04-09 DIAGNOSIS — M6281 Muscle weakness (generalized): Secondary | ICD-10-CM

## 2023-04-09 DIAGNOSIS — R296 Repeated falls: Secondary | ICD-10-CM

## 2023-04-09 DIAGNOSIS — R2681 Unsteadiness on feet: Secondary | ICD-10-CM

## 2023-04-09 NOTE — Telephone Encounter (Signed)
 Good afternoon,   I wanted to reach out on behalf of Diana [name], who is being seen currently for physical therapy. Patient cardiac response to exercise remains very poor with diastolic readings 54 dropping to 50 with exercise. Patient reports no symptoms other than visible fatigue levels. We advised possible follow up with you or would greatly appreciate your opinion on the matter. I will also route over her last PT note for you to see further details.   Thank you, Maryruth Eve, PT, DPT'
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
## 2023-04-10 NOTE — Telephone Encounter (Signed)
 Spoke with patient and she is aware of provider recommendations. She did not want to stop lasix. She was agreeable to cutting losartan to half tab daily and monitoring BP. She wanted appointment to discuss with provider. Appointment scheduled.

## 2023-04-11 ENCOUNTER — Ambulatory Visit: Payer: Medicare Other | Admitting: Physical Therapy

## 2023-04-11 DIAGNOSIS — R2689 Other abnormalities of gait and mobility: Secondary | ICD-10-CM | POA: Diagnosis not present

## 2023-04-11 DIAGNOSIS — M6281 Muscle weakness (generalized): Secondary | ICD-10-CM

## 2023-04-11 DIAGNOSIS — R296 Repeated falls: Secondary | ICD-10-CM

## 2023-04-11 DIAGNOSIS — R2681 Unsteadiness on feet: Secondary | ICD-10-CM
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
## 2023-04-16 ENCOUNTER — Ambulatory Visit: Payer: Medicare Other | Admitting: Physical Therapy

## 2023-04-16 ENCOUNTER — Encounter: Payer: Self-pay | Admitting: Physical Therapy

## 2023-04-16 VITALS — BP 124/47 | HR 64

## 2023-04-16 DIAGNOSIS — R2689 Other abnormalities of gait and mobility: Secondary | ICD-10-CM | POA: Diagnosis not present

## 2023-04-16 DIAGNOSIS — R2681 Unsteadiness on feet: Secondary | ICD-10-CM

## 2023-04-16 DIAGNOSIS — R296 Repeated falls: Secondary | ICD-10-CM

## 2023-04-16 DIAGNOSIS — M6281 Muscle weakness (generalized): Secondary | ICD-10-CM
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
## 2023-04-18 ENCOUNTER — Encounter: Payer: Self-pay | Admitting: Physical Therapy

## 2023-04-18 ENCOUNTER — Ambulatory Visit: Payer: Medicare Other | Admitting: Physical Therapy

## 2023-04-18 VITALS — BP 112/54 | HR 74

## 2023-04-18 DIAGNOSIS — R2689 Other abnormalities of gait and mobility: Secondary | ICD-10-CM | POA: Diagnosis not present

## 2023-04-18 DIAGNOSIS — M6281 Muscle weakness (generalized): Secondary | ICD-10-CM

## 2023-04-18 DIAGNOSIS — R296 Repeated falls: Secondary | ICD-10-CM

## 2023-04-18 DIAGNOSIS — R2681 Unsteadiness on feet: Secondary | ICD-10-CM
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
## 2023-04-21 NOTE — Progress Notes (Unsigned)
  Cardiology Office Note:  .   Date:  04/22/2023  ID:  Diana [name], DOB [birth year], MRN [id number] PCP: Avel Peace Chales Salmon, MD  [facility] Providers Cardiologist:  None    History of Present Illness: .   Diana [name] is a 78 y.o. female  with history of LBBB, false positive NST 2008 with mild CAD, COPD & asthma, OSA, HLD and prediabetes.  BP has been running low at PT and losartan was reduced.She has been on lasix for LE edema and she continues to have trouble with this. Not wearing compression hose. Doesn't elevate legs elevated. No DVT 10/2022 scan. She had an MRI for ? Stroke which showed chronic small vessel ischemic disease. CT abdomen 3/17shows soft tissue nodule within the pericardial fat which could reflect an enlarged lymph node and recommend CT chest with contrast. Also sever atherosclerotic palque and coronary calcification. She denies chest pain, shortness of breath, dizziness or presyncope. Eating all frozen meals and canned soups. Drinks primarily soda.     ROS:    Studies Reviewed: Marland Kitchen         Prior CV Studies:    Tests she brought in with her reviewed above. Will scan into chart.  Risk Assessment/Calculations:             Physical Exam:   VS:  BP (!) 104/40   Pulse 73   Ht 5\' 1"  (1.549 m)   Wt 142 lb 9.6 oz (64.7 kg)   SpO2 98%   BMI 26.94 kg/m    Wt Readings from Last 3 Encounters:  04/22/23 142 lb 9.6 oz (64.7 kg)  04/08/23 141 lb 9.6 oz (64.2 kg)  03/17/23 137 lb (62.1 kg)    GEN: Well nourished, well developed in no acute distress NECK: No JVD; No carotid bruits CARDIAC:  RRR, no murmurs, rubs, gallops RESPIRATORY:  Clear to auscultation without rales, wheezing or rhonchi  ABDOMEN: Soft, non-tender, non-distended EXTREMITIES:  No edema; No deformity   ASSESSMENT AND PLAN: .    Soft tissue nodule with in the pericardial fat on CT abd 04/07/23(done at [facility], scanned into chart)-could be enlarged lymph node and recommend CT chest with  contrast-will order  LE edema-left >right on low dose lasix. Eats high salt diet. Recommend 2 gm sodium diet, compression stockings  Hypotension-stop losartan, 64 ounces water daily. Would like to stop lasix but with LE edema will hold off for now  CAD-30% lesions on cath 2008-no chest pain, on ASA, zocor   LBBB   OSA-trying to come off CPAP with weight loss   HLD-LDL 58, TC 177, trig 91 09/2022   Obesity-has lost 50-60 lbs over 4 yrs.    Diabetes no history of HTN on losartan-recently started-need to stop   COPD/asthma          Dispo: f/u with me in 4-6 weeks.  Signed, Jacolyn Reedy, PA-C

## 2023-04-22 ENCOUNTER — Ambulatory Visit: Attending: Physician Assistant | Admitting: Physician Assistant

## 2023-04-22 ENCOUNTER — Encounter: Payer: Self-pay | Admitting: Physician Assistant

## 2023-04-22 VITALS — BP 104/40 | HR 73 | Ht 61.0 in | Wt 142.6 lb

## 2023-04-22 DIAGNOSIS — I2510 Atherosclerotic heart disease of native coronary artery without angina pectoris: Secondary | ICD-10-CM | POA: Diagnosis present

## 2023-04-22 DIAGNOSIS — I447 Left bundle-branch block, unspecified: Secondary | ICD-10-CM

## 2023-04-22 DIAGNOSIS — M7989 Other specified soft tissue disorders: Secondary | ICD-10-CM

## 2023-04-22 DIAGNOSIS — I2583 Coronary atherosclerosis due to lipid rich plaque: Secondary | ICD-10-CM | POA: Diagnosis present

## 2023-04-22 DIAGNOSIS — G4733 Obstructive sleep apnea (adult) (pediatric): Secondary | ICD-10-CM | POA: Diagnosis present

## 2023-04-22 DIAGNOSIS — J4489 Other specified chronic obstructive pulmonary disease: Secondary | ICD-10-CM | POA: Diagnosis present

## 2023-04-22 DIAGNOSIS — I9589 Other hypotension: Secondary | ICD-10-CM

## 2023-04-22 DIAGNOSIS — E119 Type 2 diabetes mellitus without complications: Secondary | ICD-10-CM | POA: Diagnosis present

## 2023-04-22 DIAGNOSIS — R600 Localized edema: Secondary | ICD-10-CM | POA: Diagnosis not present

## 2023-04-22 DIAGNOSIS — R599 Enlarged lymph nodes, unspecified: Secondary | ICD-10-CM | POA: Diagnosis present

## 2023-04-22 DIAGNOSIS — E782 Mixed hyperlipidemia: Secondary | ICD-10-CM | POA: Diagnosis present

## 2023-04-22 NOTE — Patient Instructions (Signed)
 Medication Instructions:  Your physician has recommended you make the following change in your medication:  STOP LOSARTAN   *If you need a refill on your cardiac medications before your next appointment, please call your pharmacy*  Lab Work: BMET If you have labs (blood work) drawn today and your tests are completely normal, you will receive your results only by: MyChart Message (if you have MyChart) OR A paper copy in the mail If you have any lab test that is abnormal or we need to change your treatment, we will call you to review the results.  Testing/Procedures: YOUR PROVIDER RECOMMENDS THAT YOU HAVE A CHEST CT.  Your physician has requested that you have an echocardiogram. Echocardiography is a painless test that uses sound waves to create images of your heart. It provides your doctor with information about the size and shape of your heart and how well your heart's chambers and valves are working. This procedure takes approximately one hour. There are no restrictions for this procedure. Please do NOT wear cologne, perfume, aftershave, or lotions (deodorant is allowed). Please arrive 15 minutes prior to your appointment time.  Please note: We ask at that you not bring children with you during ultrasound (echo/ vascular) testing. Due to room size and safety concerns, children are not allowed in the ultrasound rooms during exams. Our front office staff cannot provide observation of children in our lobby area while testing is being conducted. An adult accompanying a patient to their appointment will only be allowed in the ultrasound room at the discretion of the ultrasound technician under special circumstances. We apologize for any inconvenience.    Follow-Up: At [facility], you and your health needs are our priority.  As part of our continuing mission to provide you with exceptional heart care, our providers are all part of one team.  This team includes your primary Cardiologist  (physician) and Advanced Practice Providers or APPs (Physician Assistants and Nurse Practitioners) who all work together to provide you with the care you need, when you need it.  Your next appointment:   4-6 week(s)  Provider:   Jacolyn Reedy, PA-C        We recommend signing up for the patient portal called "MyChart".  Sign up information is provided on this After Visit Summary.  MyChart is used to connect with patients for Virtual Visits (Telemedicine).  Patients are able to view lab/test results, encounter notes, upcoming appointments, etc.  Non-urgent messages can be sent to your provider as well.   To learn more about what you can do with MyChart, go to [email or web address].   Other Instructions Two Gram Sodium Diet 2000 mg  What is Sodium? Sodium is a mineral found naturally in many foods. The most significant source of sodium in the diet is table salt, which is about 40% sodium.  Processed, convenience, and preserved foods also contain a large amount of sodium.  The body needs only 500 mg of sodium daily to function,  A normal diet provides more than enough sodium even if you do not use salt.  Why Limit Sodium? A build up of sodium in the body can cause thirst, increased blood pressure, shortness of breath, and water retention.  Decreasing sodium in the diet can reduce edema and risk of heart attack or stroke associated with high blood pressure.  Keep in mind that there are many other factors involved in these health problems.  Heredity, obesity, lack of exercise, cigarette smoking, stress and what you eat all  play a role.  General Guidelines: Do not add salt at the table or in cooking.  One teaspoon of salt contains over 2 grams of sodium. Read food labels Avoid processed and convenience foods Ask your dietitian before eating any foods not dicussed in the menu planning guidelines Consult your physician if you wish to use a salt substitute or a sodium containing medication such as  antacids.  Limit milk and milk products to 16 oz (2 cups) per day.  Shopping Hints: READ LABELS!! "Dietetic" does not necessarily mean low sodium. Salt and other sodium ingredients are often added to foods during processing.    Menu Planning Guidelines Food Group Choose More Often Avoid  Beverages (see also the milk group All fruit juices, low-sodium, salt-free vegetables juices, low-sodium carbonated beverages Regular vegetable or tomato juices, commercially softened water used for drinking or cooking  Breads and Cereals Enriched white, wheat, rye and pumpernickel bread, hard rolls and dinner rolls; muffins, cornbread and waffles; most dry cereals, cooked cereal without added salt; unsalted crackers and breadsticks; low sodium or homemade bread crumbs Bread, rolls and crackers with salted tops; quick breads; instant hot cereals; pancakes; commercial bread stuffing; self-rising flower and biscuit mixes; regular bread crumbs or cracker crumbs  Desserts and Sweets Desserts and sweets mad with mild should be within allowance Instant pudding mixes and cake mixes  Fats Butter or margarine; vegetable oils; unsalted salad dressings, regular salad dressings limited to 1 Tbs; light, sour and heavy cream Regular salad dressings containing bacon fat, bacon bits, and salt pork; snack dips made with instant soup mixes or processed cheese; salted nuts  Fruits Most fresh, frozen and canned fruits Fruits processed with salt or sodium-containing ingredient (some dried fruits are processed with sodium sulfites        Vegetables Fresh, frozen vegetables and low- sodium canned vegetables Regular canned vegetables, sauerkraut, pickled vegetables, and others prepared in brine; frozen vegetables in sauces; vegetables seasoned with ham, bacon or salt pork  Condiments, Sauces, Miscellaneous  Salt substitute with physician's approval; pepper, herbs, spices; vinegar, lemon or lime juice; hot pepper sauce; garlic powder,  onion powder, low sodium soy sauce (1 Tbs.); low sodium condiments (ketchup, chili sauce, mustard) in limited amounts (1 tsp.) fresh ground horseradish; unsalted tortilla chips, pretzels, potato chips, popcorn, salsa (1/4 cup) Any seasoning made with salt including garlic salt, celery salt, onion salt, and seasoned salt; sea salt, rock salt, kosher salt; meat tenderizers; monosodium glutamate; mustard, regular soy sauce, barbecue, sauce, chili sauce, teriyaki sauce, steak sauce, Worcestershire sauce, and most flavored vinegars; canned gravy and mixes; regular condiments; salted snack foods, olives, picles, relish, horseradish sauce, catsup   Food preparation: Try these seasonings Meats:    Pork Sage, onion Serve with applesauce  Chicken Poultry seasoning, thyme, parsley Serve with cranberry sauce  Lamb Curry powder, rosemary, garlic, thyme Serve with mint sauce or jelly  Veal Marjoram, basil Serve with current jelly, cranberry sauce  Beef Pepper, bay leaf Serve with dry mustard, unsalted chive butter  Fish [place] leaf, dill Serve with unsalted lemon butter, unsalted parsley butter  Vegetables:    Asparagus Lemon juice   Broccoli Lemon juice   Carrots Mustard dressing parsley, mint, nutmeg, glazed with unsalted butter and sugar   Green beans Marjoram, lemon juice, nutmeg,dill seed   Tomatoes Basil, marjoram, onion   Spice /blend for [employer]" 4 tsp ground thyme 1 tsp ground sage 3 tsp ground rosemary 4 tsp ground marjoram   Test your knowledge  A product that says "Salt Free" may still contain sodium. True or False Garlic Powder and Hot Pepper Sauce an be used as alternative seasonings.True or False Processed foods have more sodium than fresh foods.  True or False Canned Vegetables have less sodium than froze True or False   WAYS TO DECREASE YOUR SODIUM INTAKE Avoid the use of added salt in cooking and at the table.  Table salt (and other prepared seasonings which contain salt) is probably  one of the greatest sources of sodium in the diet.  Unsalted foods can gain flavor from the sweet, sour, and butter taste sensations of herbs and spices.  Instead of using salt for seasoning, try the following seasonings with the foods listed.  Remember: how you use them to enhance natural food flavors is limited only by your creativity... Allspice-Meat, fish, eggs, fruit, peas, red and yellow vegetables Almond Extract-Fruit baked goods Anise Seed-Sweet breads, fruit, carrots, beets, cottage cheese, cookies (tastes like licorice) Basil-Meat, fish, eggs, vegetables, rice, vegetables salads, soups, sauces Bay Leaf-Meat, fish, stews, poultry Burnet-Salad, vegetables (cucumber-like flavor) Caraway Seed-Bread, cookies, cottage cheese, meat, vegetables, cheese, rice Cardamon-Baked goods, fruit, soups Celery Powder or seed-Salads, salad dressings, sauces, meatloaf, soup, bread.Do not use  celery salt Chervil-Meats, salads, fish, eggs, vegetables, cottage cheese (parsley-like flavor) Chili Power-Meatloaf, chicken cheese, corn, eggplant, egg dishes Chives-Salads cottage cheese, egg dishes, soups, vegetables, sauces Cilantro-Salsa, casseroles Cinnamon-Baked goods, fruit, pork, lamb, chicken, carrots Cloves-Fruit, baked goods, fish, pot roast, green beans, beets, carrots Coriander-Pastry, cookies, meat, salads, cheese (lemon-orange flavor) Cumin-Meatloaf, fish,cheese, eggs, cabbage,fruit pie (caraway flavor) [employer], fruit, eggs, fish, poultry, cottage cheese, vegetables Dill Seed-Meat, cottage cheese, poultry, vegetables, fish, salads, bread Fennel Seed-Bread, cookies, apples, pork, eggs, fish, beets, cabbage, cheese, Licorice-like flavor Garlic-(buds or powder) Salads, meat, poultry, fish, bread, butter, vegetables, potatoes.Do not  use garlic salt Ginger-Fruit, vegetables, baked goods, meat, fish, poultry Horseradish Root-Meet, vegetables, butter Lemon Juice or Extract-Vegetables, fruit,  tea, baked goods, fish salads Mace-Baked goods fruit, vegetables, fish, poultry (taste like nutmeg) Maple Extract-Syrups Marjoram-Meat, chicken, fish, vegetables, breads, green salads (taste like Sage) Mint-Tea, lamb, sherbet, vegetables, desserts, carrots, cabbage Mustard, Dry or Seed-Cheese, eggs, meats, vegetables, poultry Nutmeg-Baked goods, fruit, chicken, eggs, vegetables, desserts Onion Powder-Meat, fish, poultry, vegetables, cheese, eggs, bread, rice salads (Do not use   Onion salt) Orange Extract-Desserts, baked goods Oregano-Pasta, eggs, cheese, onions, pork, lamb, fish, chicken, vegetables, green salads Paprika-Meat, fish, poultry, eggs, cheese, vegetables Parsley Flakes-Butter, vegetables, meat fish, poultry, eggs, bread, salads (certain forms may   Contain sodium Pepper-Meat fish, poultry, vegetables, eggs Peppermint Extract-Desserts, baked goods Poppy Seed-Eggs, bread, cheese, fruit dressings, baked goods, noodles, vegetables, cottage  [employer], poultry, meat, fish, cauliflower, turnips,eggs bread Saffron-Rice, bread, veal, chicken, fish, eggs Sage-Meat, fish, poultry, onions, eggplant, tomateos, pork, stews Savory-Eggs, salads, poultry, meat, rice, vegetables, soups, pork Tarragon-Meat, poultry, fish, eggs, butter, vegetables (licorice-like flavor)  Thyme-Meat, poultry, fish, eggs, vegetables, (clover-like flavor), sauces, soups Tumeric-Salads, butter, eggs, fish, rice, vegetables (saffron-like flavor) Vanilla Extract-Baked goods, candy Vinegar-Salads, vegetables, meat marinades Walnut Extract-baked goods, candy   2. Choose your Foods Wisely   The following is a list of foods to avoid which are high in sodium:  Meats-Avoid all smoked, canned, salt cured, dried and kosher meat and fish as well as Anchovies   [employer] meats:Bologna, Liverwurst, Pastrami Canned meat or fish  Marinated  herring Caviar    Pepperoni Corned Beef   Pizza Dried chipped beef  Salami Frozen breaded fish or meat Salt pork Frankfurters or hot dogs  Sardines Gefilte fish   Sausage Ham (boiled ham, Proscuitto Smoked butt    spiced ham)   Spam      TV Dinners Vegetables Canned vegetables (Regular) Relish Canned mushrooms  Sauerkraut Olives    Tomato juice Pickles  Bakery and Dessert Products Canned puddings  Cream pies Cheesecake   Decorated cakes Cookies  Beverages/Juices Tomato juice, regular  Gatorade   V-8 vegetable juice, regular  Breads and Cereals Biscuit mixes   Salted potato chips, corn chips, pretzels Bread stuffing mixes  Salted crackers and rolls Pancake and waffle mixes Self-rising flour  Seasonings Accent    Meat sauces Barbecue sauce  Meat tenderizer Catsup    Monosodium glutamate (MSG) Celery salt   Onion salt Chili sauce   Prepared mustard Garlic salt   Salt, seasoned salt, sea salt Gravy mixes   Soy sauce Horseradish   Steak sauce Ketchup   Tartar sauce Lite salt    Teriyaki sauce Marinade mixes   Worcestershire sauce  Others Baking powder   Cocoa and cocoa mixes Baking soda   Commercial casserole mixes Candy-caramels, chocolate  Dehydrated soups    Bars, fudge,nougats  Instant rice and pasta mixes Canned broth or soup  Maraschino cherries Cheese, aged and processed cheese and cheese spreads  Learning Assessment Quiz  Indicated T (for True) or F (for False) for each of the following statements:  _____ Fresh fruits and vegetables and unprocessed grains are generally low in sodium _____ Water may contain a considerable amount of sodium, depending on the source _____ You can always tell if a food is high in sodium by tasting it _____ Certain laxatives my be high in sodium and should be avoided unless prescribed   by a physician or pharmacist _____ Salt substitutes may be used freely by anyone on a sodium restricted diet _____ Sodium is present in table  salt, food additives and as a natural component of   most foods _____ Table salt is approximately 90% sodium _____ Limiting sodium intake may help prevent excess fluid accumulation in the body _____ On a sodium-restricted diet, seasonings such as bouillon soy sauce, and    cooking wine should be used in place of table salt _____ On an ingredient list, a product which lists monosodium glutamate as the first   ingredient is an appropriate food to include on a low sodium diet  Circle the best answer(s) to the following statements (Hint: there may be more than one correct answer)  11. On a low-sodium diet, some acceptable snack items are:    A. Olives  F. Bean dip   K. Grapefruit juice    B. Salted Pretzels G. Commercial Popcorn   L. Canned peaches    C. Carrot Sticks  H. Bouillon   M. Unsalted nuts   D. [place] fries  I. Peanut butter crackers N. Salami   E. Sweet pickles J. Tomato Juice   O. Pizza  12.  Seasonings that may be used freely on a reduced - sodium diet include   A. Lemon wedges F.Monosodium glutamate K. Celery seed    B.Soysauce   G. Pepper   L. Mustard powder   C. Sea salt  H. Cooking wine  M. Onion flakes   D. Vinegar  E. Prepared horseradish N. Salsa   E. Sage   J. Worcestershire sauce  O. Chutney   YOUR PROVIDER RECOMMENDS THAT YOU DRINK 64 OZ OF  WATER DAILY.      1st Floor: - Lobby - Registration  - Pharmacy  - Lab - Cafe  2nd Floor: - PV Lab - Diagnostic Testing (echo, CT, nuclear med)  3rd Floor: - Vacant  4th Floor: - TCTS (cardiothoracic surgery) - AFib Clinic - Structural Heart Clinic - Vascular Surgery  - Vascular Ultrasound  5th Floor: - [facility] (general and EP) - Clinical Pharmacy for coumadin, hypertension, lipid, weight-loss medications, and med management appointments    Valet parking services will be available as well.

## 2023-04-23 ENCOUNTER — Ambulatory Visit: Payer: Medicare Other | Attending: Neurology | Admitting: Physical Therapy

## 2023-04-23 ENCOUNTER — Encounter: Payer: Self-pay | Admitting: Physical Therapy

## 2023-04-23 VITALS — BP 145/58 | HR 63

## 2023-04-23 DIAGNOSIS — R2689 Other abnormalities of gait and mobility: Secondary | ICD-10-CM | POA: Insufficient documentation

## 2023-04-23 DIAGNOSIS — R2681 Unsteadiness on feet: Secondary | ICD-10-CM | POA: Insufficient documentation

## 2023-04-23 DIAGNOSIS — R296 Repeated falls: Secondary | ICD-10-CM | POA: Diagnosis present

## 2023-04-23 DIAGNOSIS — M6281 Muscle weakness (generalized): Secondary | ICD-10-CM | POA: Insufficient documentation

## 2023-04-23 LAB — BASIC METABOLIC PANEL WITH GFR
BUN/Creatinine Ratio: 23 (ref 12–28)
BUN: 24 mg/dL (ref 8–27)
CO2: 25 mmol/L (ref 20–29)
Calcium: 10.4 mg/dL — ABNORMAL HIGH (ref 8.7–10.3)
Chloride: 103 mmol/L (ref 96–106)
Creatinine, Ser: 1.04 mg/dL — ABNORMAL HIGH (ref 0.57–1.00)
Glucose: 153 mg/dL — ABNORMAL HIGH (ref 70–99)
Potassium: 4.5 mmol/L (ref 3.5–5.2)
Sodium: 143 mmol/L (ref 134–144)
eGFR: 55 mL/min/{1.73_m2} — ABNORMAL LOW (ref >59)

## 2023-04-23 NOTE — Therapy (Signed)
 OUTPATIENT PHYSICAL THERAPY NEURO TREATMENT 10th Visit Progress Note / DISCHARGE  PHYSICAL THERAPY DISCHARGE SUMMARY  Visits from Start of Care: 10  Current functional level related to goals / functional outcomes: See below   Remaining deficits: Recommend ongoing use of 2WW, falls risk   Education / Equipment: Recommend ongoing use of 2WW, falls risk     Patient agrees to discharge. Patient goals were  partially met and majority not met . Patient is being discharged due to lack of progress.    Patient Name: Diana [name] MRN: [id number] DOB:1945/04/04, 78 y.o., female Today's Date: 04/23/2023   PCP: Avel Peace Chales Salmon, MD REFERRING PROVIDER: Windell Norfolk, MD  END OF SESSION:  PT End of Session - 04/23/23 1109     Visit Number 10    Number of Visits 17    Date for PT Re-Evaluation 05/23/23    Authorization Type Medicare A&B/BCBS/[employer] Supplement 2025    Progress Note Due on Visit 10    PT Start Time 1106    PT Stop Time 1145    PT Time Calculation (min) 39 min    Equipment Utilized During Treatment Gait belt    Activity Tolerance Patient limited by fatigue;Other (comment)    Behavior During Therapy WFL for tasks assessed/performed              Past Medical History:  Diagnosis Date   Allergic rhinitis    Asthma    COPD (chronic obstructive pulmonary disease) (HCC)    Gout    Left bundle branch block    Sleep apnea    Umbilical hernia    Past Surgical History:  Procedure Laterality Date   ADENOIDECTOMY     BREAST LUMPECTOMY Right    CARDIAC CATHETERIZATION     CATARACT EXTRACTION, BILATERAL     ROOT CANAL     SKIN BIOPSY     TONSILLECTOMY     WISDOM TOOTH EXTRACTION     Patient Active Problem List   Diagnosis Date Noted   Peripheral edema 12/11/2022   Rhinitis, non-allergic 11/07/2019   Burn 11/10/2018   Severe obesity (BMI 35.0-39.9) with comorbidity (HCC) 12/15/2017   Asthma with COPD (HCC) 10/22/2017   Type 2 diabetes mellitus without  complication, without long-term current use of insulin (HCC) 08/02/2017   Obstructive sleep apnea 06/30/2017   Insomnia 06/30/2017   Coronary artery disease involving native coronary artery of native heart without angina pectoris 12/12/2015   Mixed hyperlipidemia 12/12/2015   Acquired hypothyroidism 03/17/2014   Gout 03/17/2014   Mild persistent asthma without complication 03/17/2014   Epiretinal membrane 07/27/2012   Open angle with borderline findings, low risk 07/27/2012   Overactive bladder 08/12/2011   Pap smear, as part of routine gynecological examination 08/12/2011   RLS (restless legs syndrome) 08/12/2011   Visit for gynecologic examination 08/12/2011   OA (osteoarthritis) 07/18/2011   Cellulitis of right leg 07/13/2011   Right leg pain 07/08/2011   Ruptured Bakers cyst 07/08/2011   Left bundle-branch block, unspecified 07/22/2003    ONSET DATE: Pt cannot recall when she started falling or started feeling off balance.  REFERRING DIAG: R41.9 (ICD-10-CM) - Cognitive complaints with normal exam R26.9 (ICD-10-CM) - Gait abnormality R29.6 (ICD-10-CM) - Multiple falls  THERAPY DIAG:  Other abnormalities of gait and mobility  Muscle weakness (generalized)  Repeated falls  Unsteadiness on feet  Rationale for Evaluation and Treatment: Rehabilitation  SUBJECTIVE:  SUBJECTIVE STATEMENT: Pt reports she is feeling fine.  Pt reports she has no recall of what exercises have been assigned to her because she is not doing them very often "and there's no use in telling you something different".  She denies recent falls or near falls.  Pt accompanied by: significant other - Husband [name] (drives her)  PERTINENT HISTORY:  OSA, Asthma w/ COPD, hypothyroidism, CAD, Gout, DM2, left bundle branch  block  PAIN:  Are you having pain? No  PRECAUTIONS: Fall  RED FLAGS: None   WEIGHT BEARING RESTRICTIONS: No  FALLS: Has patient fallen in last 6 months? Yes. Number of falls pt unsure as she "sometimes falls" usually in home w/o 2WW  LIVING ENVIRONMENT: Lives with: lives with their spouse Lives in: House/apartment Stairs:  pt does not enter second level of home and states "no need" Has following equipment at home: Single point cane and Walker - 2 wheeled  PLOF: Independent and Requires assistive device for independence  PATIENT GOALS: "probably more stability, but I didn't even know I was needing therapy"  OBJECTIVE:  Note: Objective measures were completed at Evaluation unless otherwise noted.  DIAGNOSTIC FINDINGS: MRI Head ordered - pt has not scheduled yet as she is missing the calls and cannot reach them when she returns the call later  COGNITION: Overall cognitive status: History of cognitive impairments - at baseline and No family/caregiver present to determine baseline cognitive functioning   SENSATION: Light touch: WFL - pt reports neuropathy w/o noted issues at home with hot/cold water or burning in the feet                                                                                                                TREATMENT:   Self Care:  Vitals:   04/23/23 1127 04/23/23 1129  BP: (!) 125/59 (!) 145/58  Pulse: (!) 59 63  Seated on LUE and standing  Given plateau in progress and undergoing further cardiac workup, PT recommending D/C at this time; PT educated on continued walker use and safety as patient reporting only about 50% compliance PT brought spouse back per patient request to educate as well Patient with questions on when to replace walker ends, PT educated and provided printout with additional notes as to where to get replacement for future  TherAct:  Assessed goals as noted below; educated patient on findings, also reviewed final HEP with patient  as patient with a few quesitons    [facility] PT Assessment - 04/23/23 0001       Standardized Balance Assessment   10 Meter Walk 0.5 m/s   with 2WW (modI)     Timed Up and Go Test   Normal TUG (seconds) 27.34   seconds with 2WW (modI with min vebal cues due to cognition)           PATIENT EDUCATION: Education details: 24/7 use of 2WW for fall prevention.  Reviewed fall prevention strategies.  Work on [employer] w/ reach back for safety.  Please wear hearing aides  to each PT session - pt forgot them today as she leaves them out for hair appts which are typically on Fridays. Person educated: Patient Education method: Explanation Education comprehension: verbalized understanding and needs further education  HOME EXERCISE PROGRAM: Access Code: [email or web address] URL: [email or web address] Date: 03/28/2023 Prepared by: Camille Bal  Exercises - Standing March with Counter Support  - 1 x daily - 3-4 x weekly - 3 sets - 10 reps - Mini Squat with Counter Support  - 1 x daily - 3-4 x weekly - 2 sets - 10 reps - Standing Tandem Balance with Counter Support  - 1 x daily - 3-4 x weekly - 1 sets - 4 reps - 30 seconds hold  Patient Education - How to Prevent Falls  You Can Walk For A Certain Length Of Time Each Day (PLEASE USE THE WALKER and have husband present for supervision)                          Walk 2 minutes 3-4 times per day.  GOALS: Goals reviewed with patient? Yes  SHORT TERM GOALS: Target date: 04/18/2023  Pt will be independent and compliant with initial strength and balance focused HEP in order to maintain functional progress and improve mobility. Baseline:  Performs 3 days per week (3/28) Goal status: IN PROGRESS  2.  Pt will decrease 5xSTS to </=12 seconds w/ proper hand placement in order to demonstrate decreased risk for falls and improved functional bilateral LE strength and power. Baseline: 14.26 sec w/ BUE; 25.85 sec w/ poor BUE placement (3/28) Goal status:  NOT MET  3.  Pt and husband will verbalize understanding of fall prevention strategies. Baseline: Reviewed handout as pt did not recall strategies (3/28) Goal status: NOT MET  4.  Patient will improve TUG score to 20 seconds to indicate clinically significant progress towards a decreased risk of falls and improved mobility.  Baseline: 24.62 seconds with 2WW; 16.28 sec w/ 2WW SBA (3/28) Goal status: MET  5.  Patient will improve gait speed to 0.65 m/s to indicate improvement towards the level of community ambulator in order to participate more easily in activities outside of the home.   Baseline: 0.55 m/s with 2WW Goal status: INITIAL  6.  BERG to be assessed w/ goal set as appropriate. Baseline: 35/56 Goal status: Discontinued STG for LTG only  LONG TERM GOALS: Target date: 05/16/2023  Pt will be independent and compliant with finalized strength and balance focused HEP in order to maintain functional progress and improve mobility. Baseline: To be established, reports confidence in final HEP Goal status:MET   2.  Pt and husband will report compliance to pt using 2WW 100% of the time to reduce fall risk. Baseline: Pt only using it if she leaves the house sometimes - all falls occurring without use of 2WW, reports using the walker 50% of the time Goal status: NOT MET  3.  Pt will demonstrate fall recovery at no more than minA to improve management in home environment. Baseline: pt reports mod I Goal status: D/C as undergoing further cardiac workup  4.  Patient will improve TUG score to 12 seconds to indicate clinically significant progress towards a decreased risk of falls and improved mobility.  Baseline: 24.62 seconds with 2WW; 25.85 sec w/ poor BUE placement (3/28); 27.34 sec w/ poor BUE placement (3/28) Goal status: NOT MET  5.  Patient will improve gait speed to 0.75 m/s to indicate improvement towards the  level of community ambulator in order to participate more easily in  activities outside of the home.   Baseline: 0.55 m/s with 2WW; reduced to 0.5 m/s with 2WW Goal status: NOT MET  6.  Patient will improve Berg Balance score to 45/56 or greater to indicate a decreased risk of falls and improved static stability.   Baseline: 35/56 Goal status: D/C as undergoing further cardiac workup  ASSESSMENT:  CLINICAL IMPRESSION: Physical therapist recommending discharge from skilled physical therapy due to maximized rehab potential at this time. Patient is regressing on functional measures including gait speed and TUG and demonstrates poor compliance with PT AD recommendations. PT recommending 2WW use 100% of time given patient falls risk. Patient and spouse independent with final HEP and are in agreement to D/C given plateau in progress and awaiting further cardiac workup.   OBJECTIVE IMPAIRMENTS: Abnormal gait, decreased activity tolerance, decreased balance, decreased cognition, decreased coordination, decreased endurance, decreased knowledge of condition, decreased knowledge of use of DME, decreased mobility, difficulty walking, decreased strength, decreased safety awareness, increased edema, impaired sensation, improper body mechanics, and postural dysfunction.   ACTIVITY LIMITATIONS: carrying, lifting, bending, standing, squatting, stairs, transfers, reach over head, and locomotion level  PARTICIPATION LIMITATIONS: driving and community activity  PERSONAL FACTORS: Age, Fitness, Time since onset of injury/illness/exacerbation, and 1-2 comorbidities: neuropathy and cognitive changes  are also affecting patient's functional outcome.   REHAB POTENTIAL: Good  CLINICAL DECISION MAKING: Evolving/moderate complexity  EVALUATION COMPLEXITY: Moderate  PLAN:  PT FREQUENCY: 2x/week   PT DURATION: 8 weeks  PLANNED INTERVENTIONS: 97164- PT Re-evaluation, 97110-Therapeutic exercises, 97530- Therapeutic activity, 97112- Neuromuscular re-education, 97535- Self Care, 44034-  Manual therapy, [phone]- Gait training, [phone]- Orthotic Fit/training, Patient/Family education, Balance training, Stair training, Vestibular training, and DME instructions  PLAN FOR NEXT SESSION: NA - D/C with updated HEP  Carmelia Bake, PT, DPT 04/23/2023, 12:11 PM

## 2023-04-25 ENCOUNTER — Ambulatory Visit: Payer: Medicare Other | Admitting: Physical Therapy

## 2023-04-30 ENCOUNTER — Ambulatory Visit: Payer: Medicare Other | Admitting: Physical Therapy

## 2023-05-01 ENCOUNTER — Ambulatory Visit (HOSPITAL_BASED_OUTPATIENT_CLINIC_OR_DEPARTMENT_OTHER)
Admission: RE | Admit: 2023-05-01 | Discharge: 2023-05-01 | Disposition: A | Discharge status: Home / Self Care | Source: Ambulatory Visit | Attending: Physician Assistant | Admitting: Physician Assistant

## 2023-05-01 DIAGNOSIS — R599 Enlarged lymph nodes, unspecified: Secondary | ICD-10-CM

## 2023-05-01 DIAGNOSIS — R600 Localized edema: Secondary | ICD-10-CM

## 2023-05-01 DIAGNOSIS — M7989 Other specified soft tissue disorders: Secondary | ICD-10-CM

## 2023-05-01 MED ORDER — IOHEXOL 300 MG/ML  SOLN
100.0000 mL | Freq: Once | INTRAMUSCULAR | Status: AC | PRN
  Administered 2023-05-01: 75 mL via INTRAVENOUS

## 2023-05-02 ENCOUNTER — Ambulatory Visit: Payer: Medicare Other | Admitting: Physical Therapy

## 2023-05-07 ENCOUNTER — Ambulatory Visit: Payer: Medicare Other | Admitting: Physical Therapy

## 2023-05-14 ENCOUNTER — Ambulatory Visit: Payer: Medicare Other | Admitting: Physical Therapy

## 2023-05-16 ENCOUNTER — Ambulatory Visit: Payer: Medicare Other | Admitting: Physical Therapy

## 2023-05-21 ENCOUNTER — Ambulatory Visit (HOSPITAL_COMMUNITY): Attending: Cardiovascular Disease

## 2023-05-21 DIAGNOSIS — I340 Nonrheumatic mitral (valve) insufficiency: Secondary | ICD-10-CM | POA: Diagnosis not present

## 2023-05-21 DIAGNOSIS — M7989 Other specified soft tissue disorders: Secondary | ICD-10-CM | POA: Diagnosis not present

## 2023-05-21 DIAGNOSIS — R600 Localized edema: Secondary | ICD-10-CM | POA: Diagnosis not present

## 2023-05-21 LAB — ECHOCARDIOGRAM COMPLETE
Area-P 1/2: 2.6 cm2
S' Lateral: 2.9 cm

## 2023-05-22 ENCOUNTER — Encounter: Payer: Self-pay | Admitting: *Deleted

## 2023-05-26 NOTE — Progress Notes (Signed)
 Cardiology Office Note:  .   Date:  06/03/2023  ID:  Diana [name], DOB 1945-04-08, MRN [id number] PCP: Alfonso Angles Conway Dennis, MD  [facility] Providers Cardiologist:  None    History of Present Illness: .   Diana [name] is a 78 y.o. female with history of LBBB, false positive NST 2008 with mild CAD, COPD & asthma, OSA, HLD and prediabetes.   I saw the patient 04/22/23 and BP has been running low at PT and losartan was reduced.She has been on lasix for LE edema and she continues to have trouble with this. Not wearing compression hose. Doesn't elevate legs elevated. No DVT 10/2022 scan. She had an MRI for ? Stroke which showed chronic small vessel ischemic disease. CT abdomen 3/17shows soft tissue nodule within the pericardial fat which could reflect an enlarged lymph node and recommend CT chest with contrast. Also severe atherosclerotic palque and coronary calcification.  CT with contrast showed right lateral pericardial cyst, enlarged heart and aortic atherosclerosis. Echo normal LVEF mod LVH, mild to mod MR, mod pulm HTN.   Patient comes in with her husband. She's worried about her LE edema and asking to have it drained off. She is eating a high salt diet. Not doing PT any longer. Quit using CPAP, didn't think it was helping. Reviewed test results.  ROS:    Studies Reviewed: Aaron Aas         Prior CV Studies:   CT chest with contrast 05/01/23 FINDINGS: Cardiovascular: Thoracic aorta is normal course and caliber with scattered atherosclerotic calcified plaque. Coronary artery calcifications are seen. Heart is slightly enlarged. No significant pericardial effusion. Once again there is a right lateral pericardial cyst identified on image 87 of series 2. there is some air along the venous system, possibly from the IV contrast injection.   Mediastinum/Nodes: Slightly patulous thoracic esophagus. Preserved thyroid gland. No specific abnormal lymph node enlargement identified in the  axillary regions, hilum or mediastinum.   Lungs/Pleura: Some breathing motion identified. No consolidation, pneumothorax or effusion. Basilar areas of scarring atelectatic changes. Mild areas of bronchial wall thickening identified centrally along both lungs. Once again there is a calcified nodule at the medial left lung base on series 3, image 15 consistent with old granulomatous disease. Few other small nodules identified including juxtapleural lateral right lower lobe on series 3, image 88 as well measuring 4 mm, unchanged from previous demonstrating long-term stability. Few nodules are calcified such as left lower lobe series 3, image 69 measuring 1-2 mm.   Upper Abdomen: Stable slight thickening of the adrenal glands. Atrophy of the pancreas.   Musculoskeletal: Curvature and degenerative changes of the spine with numerous bridging osteophytes and syndesmophytes.   IMPRESSION: Few tiny bilateral lung nodules identified, similar to previous demonstrated long-term stability.   Areas of bronchial wall thickening as well as some scarring atelectatic changes along the lungs.   Enlarged heart.   Aortic Atherosclerosis (ICD10-I70.0).     Electronically Signed   By: Adrianna Horde M.D.   On: 05/13/2023 17:21    Echo 04/2023 IMPRESSIONS     1. Left ventricular ejection fraction, by estimation, is 55 to 60%. The  left ventricle has normal function. The left ventricle has no regional  wall motion abnormalities. There is moderate eccentric left ventricular  hypertrophy. Left ventricular  diastolic parameters are indeterminate.   2. Right ventricular systolic function is normal. The right ventricular  size is normal. There is normal pulmonary artery systolic pressure.  3. A small pericardial effusion is present.   4. The mitral valve is degenerative. Mild mitral valve regurgitation. No  evidence of mitral stenosis.   5. The aortic valve is tricuspid. Aortic valve regurgitation  is not  visualized. No aortic stenosis is present.   6. The inferior vena cava is dilated in size with >50% respiratory  variability, suggesting right atrial pressure of 8 mmHg.   Comparison(s): Prior images unable to be directly viewed, comparison made  by report only. Echo 2017 ([facility]) EF 60-65%, G1DD, moderate LVH,  mild-moderate MR, moderate pulm hypertension.   Conclusion(s)/Recommendation(s): Otherwise normal echocardiogram, with  minor abnormalities described in the report.   CT with contrast 04/2023 IMPRESSION: Few tiny bilateral lung nodules identified, similar to previous demonstrated long-term stability.   Areas of bronchial wall thickening as well as some scarring atelectatic changes along the lungs.   Enlarged heart.   Aortic Atherosclerosis (ICD10-I70.0).     Electronically Signed   By: Adrianna Horde M.D.   On: 05/13/2023 17:21   Risk Assessment/Calculations:             Physical Exam:   VS:  BP 115/66   Pulse (!) 59   Ht 5\' 1"  (1.549 m)   Wt 144 lb (65.3 kg)   SpO2 95%   BMI 27.21 kg/m    Wt Readings from Last 3 Encounters:  06/03/23 144 lb (65.3 kg)  04/22/23 142 lb 9.6 oz (64.7 kg)  04/08/23 141 lb 9.6 oz (64.2 kg)    GEN: Well nourished, well developed in no acute distress NECK: No JVD; No carotid bruits CARDIAC:  RRR, no murmurs, rubs, gallops RESPIRATORY:  Clear to auscultation without rales, wheezing or rhonchi  ABDOMEN: Soft, non-tender, non-distended EXTREMITIES:  LLE edema > R; No deformity   ASSESSMENT AND PLAN: .    Soft tissue nodule with in the pericardial fat on CT abd 04/07/23(done at [facility], scanned into chart)- CT chest with contrast--pericardial cyst which are usually benign. Reassurance given   LE edema-left >right on low dose lasix. Eats high salt diet. Recommend 2 gm sodium diet, compression stockings, elevate legs. Increase lasix 40 mg daily x 4 days then 40 mg alternating 20 mg every other day. Increase Kdur 20 meq 2 daily  for 4 days then once daily   Hypotension-improved with stopping losartan, needs to increase water intake to 64 ounces water daily. Would like to stop lasix but with LE edema will hold off for now   CAD-30% lesions on cath 2008-no chest pain, on ASA, zocor   LBBB   OSA-she quit CPAP with weight loss. Recommend she restart   HLD-LDL 58, TC 177, trig 91 09/2022   Obesity-has lost 50-60 lbs over 4 yrs.    Diabetes no history of HTN on losartan-recently started-need to stop   COPD/asthma            Dispo: f/u  Signed, Theotis Flake, PA-C

## 2023-06-03 ENCOUNTER — Encounter: Payer: Self-pay | Admitting: Physician Assistant

## 2023-06-03 ENCOUNTER — Ambulatory Visit: Attending: Physician Assistant | Admitting: Physician Assistant

## 2023-06-03 VITALS — BP 115/66 | HR 59 | Ht 61.0 in | Wt 144.0 lb

## 2023-06-03 DIAGNOSIS — J4489 Other specified chronic obstructive pulmonary disease: Secondary | ICD-10-CM

## 2023-06-03 DIAGNOSIS — I2510 Atherosclerotic heart disease of native coronary artery without angina pectoris: Secondary | ICD-10-CM

## 2023-06-03 DIAGNOSIS — M7989 Other specified soft tissue disorders: Secondary | ICD-10-CM

## 2023-06-03 DIAGNOSIS — E782 Mixed hyperlipidemia: Secondary | ICD-10-CM

## 2023-06-03 DIAGNOSIS — R600 Localized edema: Secondary | ICD-10-CM

## 2023-06-03 DIAGNOSIS — G4733 Obstructive sleep apnea (adult) (pediatric): Secondary | ICD-10-CM | POA: Diagnosis present

## 2023-06-03 DIAGNOSIS — E119 Type 2 diabetes mellitus without complications: Secondary | ICD-10-CM

## 2023-06-03 DIAGNOSIS — I447 Left bundle-branch block, unspecified: Secondary | ICD-10-CM | POA: Diagnosis present

## 2023-06-03 DIAGNOSIS — I9589 Other hypotension: Secondary | ICD-10-CM | POA: Diagnosis present

## 2023-06-03 NOTE — Patient Instructions (Signed)
 Medication Instructions:  Your physician has recommended you make the following change in your medication:  INCREASE LASIX TO 40 MG DAILY FOR 4 DAYS AND THEN BACK TO  20 MG 1 DAY AND 40 MG ALL THE OTHER DAYS. INCREASE POTASSIUM TO 20 MEQ TWICE A DAY FOR 4 DAYS THEN BACK TO  20 MG DAILY *If you need a refill on your cardiac medications before your next appointment, please call your pharmacy*  Lab Work: NONE If you have labs (blood work) drawn today and your tests are completely normal, you will receive your results only by: MyChart Message (if you have MyChart) OR A paper copy in the mail If you have any lab test that is abnormal or we need to change your treatment, we will call you to review the results.  Testing/Procedures: NONE  Follow-Up: At [facility], you and your health needs are our priority.  As part of our continuing mission to provide you with exceptional heart care, our providers are all part of one team.  This team includes your primary Cardiologist (physician) and Advanced Practice Providers or APPs (Physician Assistants and Nurse Practitioners) who all work together to provide you with the care you need, when you need it.  Your next appointment:   6 month(s)  Provider:   DR. Renna Cary    We recommend signing up for the patient portal called "MyChart".  Sign up information is provided on this After Visit Summary.  MyChart is used to connect with patients for Virtual Visits (Telemedicine).  Patients are able to view lab/test results, encounter notes, upcoming appointments, etc.  Non-urgent messages can be sent to your provider as well.   To learn more about what you can do with MyChart, go to [email or web address].   Other Instructions YOUR PROVIDER RECOMMENDS THAT YOU INCREASE YOUR WATER INTAKE AND ELEVATE YOU LEGS WHEN YOU CAN.      Two Gram Sodium Diet 2000 mg  What is Sodium? Sodium is a mineral found naturally in many foods. The most significant source of  sodium in the diet is table salt, which is about 40% sodium.  Processed, convenience, and preserved foods also contain a large amount of sodium.  The body needs only 500 mg of sodium daily to function,  A normal diet provides more than enough sodium even if you do not use salt.  Why Limit Sodium? A build up of sodium in the body can cause thirst, increased blood pressure, shortness of breath, and water retention.  Decreasing sodium in the diet can reduce edema and risk of heart attack or stroke associated with high blood pressure.  Keep in mind that there are many other factors involved in these health problems.  Heredity, obesity, lack of exercise, cigarette smoking, stress and what you eat all play a role.  General Guidelines: Do not add salt at the table or in cooking.  One teaspoon of salt contains over 2 grams of sodium. Read food labels Avoid processed and convenience foods Ask your dietitian before eating any foods not dicussed in the menu planning guidelines Consult your physician if you wish to use a salt substitute or a sodium containing medication such as antacids.  Limit milk and milk products to 16 oz (2 cups) per day.  Shopping Hints: READ LABELS!! "Dietetic" does not necessarily mean low sodium. Salt and other sodium ingredients are often added to foods during processing.    Menu Planning Guidelines Food Group Choose More Often Avoid  Beverages (see also the  milk group All fruit juices, low-sodium, salt-free vegetables juices, low-sodium carbonated beverages Regular vegetable or tomato juices, commercially softened water used for drinking or cooking  Breads and Cereals Enriched white, wheat, rye and pumpernickel bread, hard rolls and dinner rolls; muffins, cornbread and waffles; most dry cereals, cooked cereal without added salt; unsalted crackers and breadsticks; low sodium or homemade bread crumbs Bread, rolls and crackers with salted tops; quick breads; instant hot cereals;  pancakes; commercial bread stuffing; self-rising flower and biscuit mixes; regular bread crumbs or cracker crumbs  Desserts and Sweets Desserts and sweets mad with mild should be within allowance Instant pudding mixes and cake mixes  Fats Butter or margarine; vegetable oils; unsalted salad dressings, regular salad dressings limited to 1 Tbs; light, sour and heavy cream Regular salad dressings containing bacon fat, bacon bits, and salt pork; snack dips made with instant soup mixes or processed cheese; salted nuts  Fruits Most fresh, frozen and canned fruits Fruits processed with salt or sodium-containing ingredient (some dried fruits are processed with sodium sulfites        Vegetables Fresh, frozen vegetables and low- sodium canned vegetables Regular canned vegetables, sauerkraut, pickled vegetables, and others prepared in brine; frozen vegetables in sauces; vegetables seasoned with ham, bacon or salt pork  Condiments, Sauces, Miscellaneous  Salt substitute with physician's approval; pepper, herbs, spices; vinegar, lemon or lime juice; hot pepper sauce; garlic powder, onion powder, low sodium soy sauce (1 Tbs.); low sodium condiments (ketchup, chili sauce, mustard) in limited amounts (1 tsp.) fresh ground horseradish; unsalted tortilla chips, pretzels, potato chips, popcorn, salsa (1/4 cup) Any seasoning made with salt including garlic salt, celery salt, onion salt, and seasoned salt; sea salt, rock salt, kosher salt; meat tenderizers; monosodium glutamate; mustard, regular soy sauce, barbecue, sauce, chili sauce, teriyaki sauce, steak sauce, Worcestershire sauce, and most flavored vinegars; canned gravy and mixes; regular condiments; salted snack foods, olives, picles, relish, horseradish sauce, catsup   Food preparation: Try these seasonings Meats:    Pork Sage, onion Serve with applesauce  Chicken Poultry seasoning, thyme, parsley Serve with cranberry sauce  Lamb Curry powder, rosemary, garlic,  thyme Serve with mint sauce or jelly  Veal Marjoram, basil Serve with current jelly, cranberry sauce  Beef Pepper, bay leaf Serve with dry mustard, unsalted chive butter  [place] leaf, dill Serve with unsalted lemon butter, unsalted parsley butter  Vegetables:    Asparagus Lemon juice   Broccoli Lemon juice   Carrots Mustard dressing parsley, mint, nutmeg, glazed with unsalted butter and sugar   Green beans Marjoram, lemon juice, nutmeg,dill seed   Tomatoes Basil, marjoram, onion   Spice /blend for [employer]" 4 tsp ground thyme 1 tsp ground sage 3 tsp ground rosemary 4 tsp ground marjoram   Test your knowledge A product that says "Salt Free" may still contain sodium. True or False Garlic Powder and Hot Pepper Sauce an be used as alternative seasonings.True or False Processed foods have more sodium than fresh foods.  True or False Canned Vegetables have less sodium than froze True or False   WAYS TO DECREASE YOUR SODIUM INTAKE Avoid the use of added salt in cooking and at the table.  Table salt (and other prepared seasonings which contain salt) is probably one of the greatest sources of sodium in the diet.  Unsalted foods can gain flavor from the sweet, sour, and butter taste sensations of herbs and spices.  Instead of using salt for seasoning, try the following seasonings with the  foods listed.  Remember: how you use them to enhance natural food flavors is limited only by your creativity... Allspice-Meat, fish, eggs, fruit, peas, red and yellow vegetables Almond Extract-Fruit baked goods Anise Seed-Sweet breads, fruit, carrots, beets, cottage cheese, cookies (tastes like licorice) Basil-Meat, fish, eggs, vegetables, rice, vegetables salads, soups, sauces Bay Leaf-Meat, fish, stews, poultry Burnet-Salad, vegetables (cucumber-like flavor) Caraway Seed-Bread, cookies, cottage cheese, meat, vegetables, cheese, rice Cardamon-Baked goods, fruit, soups Celery Powder or seed-Salads, salad  dressings, sauces, meatloaf, soup, bread.Do not use  celery salt Chervil-Meats, salads, fish, eggs, vegetables, cottage cheese (parsley-like flavor) Chili Power-Meatloaf, chicken cheese, corn, eggplant, egg dishes Chives-Salads cottage cheese, egg dishes, soups, vegetables, sauces Cilantro-Salsa, casseroles Cinnamon-Baked goods, fruit, pork, lamb, chicken, carrots Cloves-Fruit, baked goods, fish, pot roast, green beans, beets, carrots Coriander-Pastry, cookies, meat, salads, cheese (lemon-orange flavor) Cumin-Meatloaf, fish,cheese, eggs, cabbage,fruit pie (caraway flavor) [employer], fruit, eggs, fish, poultry, cottage cheese, vegetables Dill Seed-Meat, cottage cheese, poultry, vegetables, fish, salads, bread Fennel Seed-Bread, cookies, apples, pork, eggs, fish, beets, cabbage, cheese, Licorice-like flavor Garlic-(buds or powder) Salads, meat, poultry, fish, bread, butter, vegetables, potatoes.Do not  use garlic salt Ginger-Fruit, vegetables, baked goods, meat, fish, poultry Horseradish Root-Meet, vegetables, butter Lemon Juice or Extract-Vegetables, fruit, tea, baked goods, fish salads Mace-Baked goods fruit, vegetables, fish, poultry (taste like nutmeg) Maple Extract-Syrups Marjoram-Meat, chicken, fish, vegetables, breads, green salads (taste like Sage) Mint-Tea, lamb, sherbet, vegetables, desserts, carrots, cabbage Mustard, Dry or Seed-Cheese, eggs, meats, vegetables, poultry Nutmeg-Baked goods, fruit, chicken, eggs, vegetables, desserts Onion Powder-Meat, fish, poultry, vegetables, cheese, eggs, bread, rice salads (Do not use   Onion salt) Orange Extract-Desserts, baked goods Oregano-Pasta, eggs, cheese, onions, pork, lamb, fish, chicken, vegetables, green salads Paprika-Meat, fish, poultry, eggs, cheese, vegetables Parsley Flakes-Butter, vegetables, meat fish, poultry, eggs, bread, salads (certain forms may   Contain sodium Pepper-Meat fish, poultry, vegetables,  eggs Peppermint Extract-Desserts, baked goods Poppy Seed-Eggs, bread, cheese, fruit dressings, baked goods, noodles, vegetables, cottage  [employer], poultry, meat, fish, cauliflower, turnips,eggs bread Saffron-Rice, bread, veal, chicken, fish, eggs Sage-Meat, fish, poultry, onions, eggplant, tomateos, pork, stews Savory-Eggs, salads, poultry, meat, rice, vegetables, soups, pork Tarragon-Meat, poultry, fish, eggs, butter, vegetables (licorice-like flavor)  Thyme-Meat, poultry, fish, eggs, vegetables, (clover-like flavor), sauces, soups Tumeric-Salads, butter, eggs, fish, rice, vegetables (saffron-like flavor) Vanilla Extract-Baked goods, candy Vinegar-Salads, vegetables, meat marinades Walnut Extract-baked goods, candy   2. Choose your Foods Wisely   The following is a list of foods to avoid which are high in sodium:  Meats-Avoid all smoked, canned, salt cured, dried and kosher meat and fish as well as Anchovies   [employer] meats:Bologna, Liverwurst, Pastrami Canned meat or fish  Marinated herring Caviar    Pepperoni Corned Beef   Pizza Dried chipped beef  Salami Frozen breaded fish or meat Salt pork Frankfurters or hot dogs  Sardines Gefilte fish   Sausage Ham (boiled ham, Proscuitto Smoked butt    spiced ham)   Spam      TV Dinners Vegetables Canned vegetables (Regular) Relish Canned mushrooms  Sauerkraut Olives    Tomato juice Pickles  Bakery and Dessert Products Canned puddings  Cream pies Cheesecake   Decorated cakes Cookies  Beverages/Juices Tomato juice, regular  Gatorade   V-8 vegetable juice, regular  Breads and Cereals Biscuit mixes   Salted potato chips, corn chips, pretzels Bread stuffing mixes  Salted crackers and rolls Pancake and waffle mixes Self-rising flour  Seasonings Accent    Meat sauces Barbecue sauce  Meat tenderizer Catsup    Monosodium glutamate (MSG) Celery salt   Onion salt Chili  sauce   Prepared mustard Garlic salt   Salt, seasoned salt, sea salt Gravy mixes   Soy sauce Horseradish   Steak sauce Ketchup   Tartar sauce Lite salt    Teriyaki sauce Marinade mixes   Worcestershire sauce  Others Baking powder   Cocoa and cocoa mixes Baking soda   Commercial casserole mixes Candy-caramels, chocolate  Dehydrated soups    Bars, fudge,nougats  Instant rice and pasta mixes Canned broth or soup  Maraschino cherries Cheese, aged and processed cheese and cheese spreads  Learning Assessment Quiz  Indicated T (for True) or F (for False) for each of the following statements:  _____ Fresh fruits and vegetables and unprocessed grains are generally low in sodium _____ Water may contain a considerable amount of sodium, depending on the source _____ You can always tell if a food is high in sodium by tasting it _____ Certain laxatives my be high in sodium and should be avoided unless prescribed   by a physician or pharmacist _____ Salt substitutes may be used freely by anyone on a sodium restricted diet _____ Sodium is present in table salt, food additives and as a natural component of   most foods _____ Table salt is approximately 90% sodium _____ Limiting sodium intake may help prevent excess fluid accumulation in the body _____ On a sodium-restricted diet, seasonings such as bouillon soy sauce, and    cooking wine should be used in place of table salt _____ On an ingredient list, a product which lists monosodium glutamate as the first   ingredient is an appropriate food to include on a low sodium diet  Circle the best answer(s) to the following statements (Hint: there may be more than one correct answer)  11. On a low-sodium diet, some acceptable snack items are:    A. Olives  F. Bean dip   K. Grapefruit juice    B. Salted Pretzels G. Commercial Popcorn   L. Canned peaches    C. Carrot Sticks  H. Bouillon   M. Unsalted nuts   D. [place] fries  I. Peanut butter  crackers N. Salami   E. Sweet pickles J. Tomato Juice   O. Pizza  12.  Seasonings that may be used freely on a reduced - sodium diet include   A. Lemon wedges F.Monosodium glutamate K. Celery seed    B.Soysauce   G. Pepper   L. Mustard powder   C. Sea salt  H. Cooking wine  M. Onion flakes   D. Vinegar  E. Prepared horseradish N. Salsa   E. Sage   J. Worcestershire sauce  O. Chutney

## 2023-06-16 ENCOUNTER — Other Ambulatory Visit: Payer: Self-pay | Admitting: Internal Medicine

## 2023-06-27 ENCOUNTER — Other Ambulatory Visit: Payer: Self-pay | Admitting: Internal Medicine

## 2023-07-01 ENCOUNTER — Other Ambulatory Visit: Payer: Self-pay | Admitting: Internal Medicine

## 2023-07-01 NOTE — Progress Notes (Signed)
 [facility] - Clinic Note  07/08/2023   CHIEF COMPLAINT Patient presents for Retina Follow Up  HISTORY OF PRESENT ILLNESS: Diana [name] is a 78 y.o. female who presents to the clinic today for:  HPI     Retina Follow Up   Patient presents with  Other.  In both eyes.  This started 6 months ago.  I, the attending physician,  performed the HPI with the patient and updated documentation appropriately.        Comments   Patient here for 6 months retina follow up for ERM OU. Patient states vision is ok. No eye pain. Xdemvy .25% 10 ml added to medication list.      Last edited by Valdemar Rogue, MD on 07/13/2023 12:01 PM.    Pt is about to start Xdemvy per Dr. Kennyth, she states she is using brominidine and had punctal plugs put in  Referring physician: Kennyth Cy RAMAN, DO [address] [place],  [place] [address]  HISTORICAL INFORMATION:  Selected notes from the MEDICAL RECORD NUMBER Referred by Dr. Kennyth for ERM w/ cystic changes OU LEE:  Ocular Hx- PMH-   CURRENT MEDICATIONS: Current Outpatient Medications (Ophthalmic Drugs)  Medication Sig   Lotilaner (XDEMVY) 0.25 % SOLN Apply 25 mLs to eye once.   No current facility-administered medications for this visit. (Ophthalmic Drugs)   Current Outpatient Medications (Other)  Medication Sig   albuterol  (VENTOLIN  HFA) 108 (90 Base) MCG/ACT inhaler INHALE 2 PUFFS INTO THE LUNGS EVERY 6 HOURS AS NEEDED FOR WHEEZING OR SHORTNESS OF BREATH   allopurinol (ZYLOPRIM) 300 MG tablet Take 300 mg by mouth daily.   aspirin EC 81 MG tablet Take 81 mg by mouth daily.   budesonide -formoterol  (SYMBICORT ) 160-4.5 MCG/ACT inhaler INHALE 2 PUFFS INTO THE LUNGS TWICE DAILY   CALCIUM-MAGNESIUM PO Take 1 tablet by mouth daily.   cetirizine (ZYRTEC) 10 MG tablet Take 10 mg by mouth daily.   cholecalciferol (VITAMIN D) 1000 units tablet Take 1,000 Units by mouth daily.   diphenhydrAMINE (BENADRYL) 25 mg capsule Take 25 mg by  mouth every 6 (six) hours as needed for allergies.   EPINEPHrine 0.3 mg/0.3 mL IJ SOAJ injection Inject 0.3 mg into the muscle as needed (anaphylaxis).   Estradiol 10 MCG TABS vaginal tablet Place vaginally.   fluticasone  (FLONASE ) 50 MCG/ACT nasal spray SHAKE LIQUID AND USE 2 SPRAYS IN EACH NOSTRIL EVERY DAY AS NEEDED   furosemide (LASIX) 20 MG tablet Take 20 mg by mouth daily.   ipratropium-albuterol  (DUONEB) 0.5-2.5 (3) MG/3ML SOLN Take 3 mLs by nebulization every 6 (six) hours as needed (shortness of breath).   levothyroxine (SYNTHROID) 50 MCG tablet TAKE 1 TABLET BY MOUTH DAILY   metFORMIN (GLUCOPHAGE) 500 MG tablet Take 500 mg by mouth 2 (two) times daily with a meal.   montelukast  (SINGULAIR ) 10 MG tablet Take 1 tablet (10 mg total) by mouth at bedtime.   Multiple Vitamins-Minerals (MULTIVITAMIN WITH MINERALS) tablet Take 1 tablet by mouth daily.   OMEGA-3 FATTY ACIDS PO Take 1 capsule by mouth daily.   potassium chloride SA (K-DUR,KLOR-CON) 20 MEQ tablet Take 20 mEq by mouth daily.   simvastatin (ZOCOR) 20 MG tablet TK 1 T PO HS   temazepam (RESTORIL) 30 MG capsule 30 mg at bedtime as needed.   theophylline  (UNIPHYL) 400 MG 24 hr tablet TAKE 2 TABLETS BY MOUTH EVERY DAY   thiamine (VITAMIN B-1) 50 MG tablet Take 50 mg by mouth daily.  Tiotropium Bromide  Monohydrate (SPIRIVA  RESPIMAT) 1.25 MCG/ACT AERS INHALE 2 PUFFS INTO THE LUNGS DAILY   vitamin B-12 (CYANOCOBALAMIN) 1000 MCG tablet Take 1,000 mcg by mouth daily.   vitamin E 400 UNIT capsule Take 400 Units by mouth daily.   No current facility-administered medications for this visit. (Other)   REVIEW OF SYSTEMS: ROS   Positive for: Endocrine, Eyes Last edited by Orval Asberry RAMAN, COA on 07/08/2023  1:42 PM.     ALLERGIES Allergies  Allergen Reactions   Molds & Smuts Shortness Of Breath   Other Shortness Of Breath    CAT   Pollen Extract Shortness Of Breath    Corn Pollen   PAST MEDICAL HISTORY Past Medical History:   Diagnosis Date   Allergic rhinitis    Asthma    COPD (chronic obstructive pulmonary disease) (HCC)    Gout    Left bundle branch block    Sleep apnea    Umbilical hernia    Past Surgical History:  Procedure Laterality Date   ADENOIDECTOMY     BREAST LUMPECTOMY Right    CARDIAC CATHETERIZATION     CATARACT EXTRACTION, BILATERAL     ROOT CANAL     SKIN BIOPSY     TONSILLECTOMY     WISDOM TOOTH EXTRACTION     FAMILY HISTORY Family History  Problem Relation Age of Onset   Heart disease Mother    Stroke Brother    SOCIAL HISTORY Social History   Tobacco Use   Smoking status: Never   Smokeless tobacco: Never  Vaping Use   Vaping status: Never Used  Substance Use Topics   Alcohol use: No   Drug use: No       OPHTHALMIC EXAM:  Base Eye Exam     Visual Acuity (Snellen - Linear)       Right Left   Dist [place] 20/20 -2 20/20 -2  Patient mono vision OD distance and OS near used J Card OS        Tonometry (Tonopen, 1:40 PM)       Right Left   Pressure 15 14         Pupils       Dark Light Shape React APD   Right 3 2 Round Brisk None   Left 3 2 Round Brisk None         Visual Fields (Counting fingers)       Left Right    Full Full         Extraocular Movement       Right Left    Full, Ortho Full, Ortho         Neuro/Psych     Oriented x3: Yes   Mood/Affect: Normal         Dilation     Both eyes: 1.0% Mydriacyl, 2.5% Phenylephrine @ 1:40 PM           Slit Lamp and Fundus Exam     Slit Lamp Exam       Right Left   Lids/Lashes Dermatochalasis - upper lid, mild MGD Dermatochalasis - upper lid, mild MGD   Conjunctiva/Sclera White and quiet White and quiet   Cornea Trace Punctate epithelial erosions, well healed cataract wound 1+ inferior Punctate epithelial erosions, well healed cataract wound, mild arcus   Anterior Chamber deep and clear deep and clear   Iris Round and dilated Round and dilated   Lens PC IOL in good position  Toric PC IOL in good  position with marks at 0730 and 0130   Anterior Vitreous mild syneresis, Posterior vitreous detachment mild syneresis, Posterior vitreous detachment         Fundus Exam       Right Left   Disc mild Pallor, Sharp rim Pink and Sharp, thin inferior rim   C/D Ratio 0.6 0.7   Macula Flat, Blunted foveal reflex, RPE mottling, mild ERM with central cystic changs, No heme or edema Flat, Blunted foveal reflex, drusen, RPE mottling, mild ERM with trace cystic changs, No heme or edema   Vessels attenuated, Tortuous attenuated, mild tortuosity   Periphery Attached, 360 reticular degeneration, scattered peripheral drusen, No heme Attached, 360 reticular degeneration, scattered peripheral drusen, rare MA temporal periphery           IMAGING AND PROCEDURES  Imaging and Procedures for 07/08/2023  OCT, Retina - OU - Both Eyes       Right Eye Quality was good. Central Foveal Thickness: 330. Progression has been stable. Findings include no SRF, abnormal foveal contour, retinal drusen , epiretinal membrane, intraretinal fluid, outer retinal atrophy, vitreomacular adhesion (Mild ERM with central cystic changes and central foveal notch--no sig change from prior. Scattered drusen, patchy ORA. ).   Left Eye Quality was good. Central Foveal Thickness: 278. Progression has been stable. Findings include normal foveal contour, no SRF, retinal drusen , intraretinal fluid, vitreomacular adhesion (ERM with central cystic changes).   Notes *Images captured and stored on drive  Diagnosis / Impression:  OD: Mild ERM with central cystic changes and central foveal notch--no significant change from prior. Scattered drusen, patchy ORA.  OS: ERM with central cystic changes  Clinical management:  See below  Abbreviations: NFP - Normal foveal profile. CME - cystoid macular edema. PED - pigment epithelial detachment. IRF - intraretinal fluid. SRF - subretinal fluid. EZ - ellipsoid zone. ERM -  epiretinal membrane. ORA - outer retinal atrophy. ORT - outer retinal tubulation. SRHM - subretinal hyper-reflective material. IRHM - intraretinal hyper-reflective material           ASSESSMENT/PLAN:   ICD-10-CM   1. Epiretinal membrane (ERM) of both eyes  H35.373 OCT, Retina - OU - Both Eyes    2. Intermediate stage nonexudative age-related macular degeneration of both eyes  H35.3132     3. Diabetes mellitus type 2 without retinopathy (HCC)  E11.9     4. Long term (current) use of oral hypoglycemic drugs  Z79.84     5. Pseudophakia of both eyes  Z96.1      Epiretinal membrane, both eyes  - mild ERM w/ central cystic changes OU; OD with central foveal notch - BCVA 20/20 OU - OCT shows OD: Mild ERM with central cystic changes and central foveal notch--no significant change from prior. Scattered drusen, patchy ORA; OS: ERM with central cystic changes - FA (05.20.24) shows no central leakage or staining within central cysts - asymptomatic, no metamorphopsia - no indication for surgery at this time - monitor for now - recommended 9 month follow up, but pt prefers to stay at 6 months - f/u 6 mos -- DFE/OCT  2. Age related macular degeneration, non-exudative, both eyes  - intermediate stage  - The incidence, anatomy, and pathology of dry AMD, risk of progression, and the AREDS and AREDS 2 study including smoking risks discussed with patient.  - Recommend amsler grid monitoring  - monitor  3,4. Diabetes mellitus, type 2 without retinopathy - The incidence, risk factors for progression, natural history and treatment  options for diabetic retinopathy  were discussed with patient.   - The need for close monitoring of blood glucose, blood pressure, and serum lipids, avoiding cigarette or any type of tobacco, and the need for long term follow up was also discussed with patient. - f/u in 1 year, sooner prn  5. Pseudophakia OU  - s/p CE/IOL OU  - IOL in good position, doing well  -  monitor  Ophthalmic Meds Ordered this visit:  No orders of the defined types were placed in this encounter.    Return in about 6 months (around 01/07/2024) for ERM OU, DFE, OCT .  There are no Patient Instructions on file for this visit.  Explained the diagnoses, plan, and follow up with the patient and they expressed understanding.  Patient expressed understanding of the importance of proper follow up care.   This document serves as a record of services personally performed by Redell JUDITHANN Hans, MD, PhD. It was created on their behalf by Almetta Pesa, an ophthalmic technician. The creation of this record is the provider's dictation and/or activities during the visit.    Electronically signed by: Almetta Pesa, OA, 07/13/23  12:03 PM  This document serves as a record of services personally performed by Redell JUDITHANN Hans, MD, PhD. It was created on their behalf by Alan PARAS. Delores, OA an ophthalmic technician. The creation of this record is the provider's dictation and/or activities during the visit.    Electronically signed by: Alan PARAS. Delores, OA 07/13/23 12:03 PM  Redell JUDITHANN Hans, M.D., Ph.D. Diseases & Surgery of the Retina and Vitreous [facility] 07/08/2023  I have reviewed the above documentation for accuracy and completeness, and I agree with the above. Redell JUDITHANN Hans, M.D., Ph.D. 07/13/23 12:05 PM   Abbreviations: M myopia (nearsighted); A astigmatism; H hyperopia (farsighted); P presbyopia; Mrx spectacle prescription;  CTL contact lenses; OD right eye; OS left eye; OU both eyes  XT exotropia; ET esotropia; PEK punctate epithelial keratitis; PEE punctate epithelial erosions; DES dry eye syndrome; MGD meibomian gland dysfunction; ATs artificial tears; PFAT's preservative free artificial tears; NSC nuclear sclerotic cataract; PSC posterior subcapsular cataract; ERM epi-retinal membrane; PVD posterior vitreous detachment; RD retinal detachment; DM diabetes  mellitus; DR diabetic retinopathy; NPDR non-proliferative diabetic retinopathy; PDR proliferative diabetic retinopathy; CSME clinically significant macular edema; DME diabetic macular edema; dbh dot blot hemorrhages; CWS cotton wool spot; POAG primary open angle glaucoma; C/D cup-to-disc ratio; HVF humphrey visual field; GVF goldmann visual field; OCT optical coherence tomography; IOP intraocular pressure; BRVO Branch retinal vein occlusion; CRVO central retinal vein occlusion; CRAO central retinal artery occlusion; BRAO branch retinal artery occlusion; RT retinal tear; SB scleral buckle; PPV pars plana vitrectomy; VH Vitreous hemorrhage; PRP panretinal laser photocoagulation; IVK intravitreal kenalog; VMT vitreomacular traction; MH Macular hole;  NVD neovascularization of the disc; NVE neovascularization elsewhere; AREDS age related eye disease study; ARMD age related macular degeneration; POAG primary open angle glaucoma; EBMD epithelial/anterior basement membrane dystrophy; ACIOL anterior chamber intraocular lens; IOL intraocular lens; PCIOL posterior chamber intraocular lens; Phaco/IOL phacoemulsification with intraocular lens placement; PRK photorefractive keratectomy; LASIK laser assisted in situ keratomileusis; HTN hypertension; DM diabetes mellitus; COPD chronic obstructive pulmonary disease

## 2023-07-08 ENCOUNTER — Encounter (INDEPENDENT_AMBULATORY_CARE_PROVIDER_SITE_OTHER): Payer: Self-pay | Admitting: Ophthalmology

## 2023-07-08 ENCOUNTER — Ambulatory Visit (INDEPENDENT_AMBULATORY_CARE_PROVIDER_SITE_OTHER): Payer: Medicare Other | Admitting: Ophthalmology

## 2023-07-08 DIAGNOSIS — Z961 Presence of intraocular lens: Secondary | ICD-10-CM

## 2023-07-08 DIAGNOSIS — Z7984 Long term (current) use of oral hypoglycemic drugs: Secondary | ICD-10-CM

## 2023-07-08 DIAGNOSIS — H353132 Nonexudative age-related macular degeneration, bilateral, intermediate dry stage: Secondary | ICD-10-CM

## 2023-07-08 DIAGNOSIS — E119 Type 2 diabetes mellitus without complications: Secondary | ICD-10-CM | POA: Diagnosis not present

## 2023-07-08 DIAGNOSIS — H35373 Puckering of macula, bilateral: Secondary | ICD-10-CM

## 2023-07-13 ENCOUNTER — Encounter (INDEPENDENT_AMBULATORY_CARE_PROVIDER_SITE_OTHER): Payer: Self-pay | Admitting: Ophthalmology

## 2023-07-18 ENCOUNTER — Other Ambulatory Visit: Payer: Self-pay

## 2023-09-03 ENCOUNTER — Ambulatory Visit: Admitting: Neurology

## 2023-09-03 ENCOUNTER — Encounter: Payer: Self-pay | Admitting: Neurology

## 2023-09-03 VITALS — BP 106/61 | HR 74 | Ht 61.0 in | Wt 138.5 lb

## 2023-09-03 DIAGNOSIS — G5603 Carpal tunnel syndrome, bilateral upper limbs: Secondary | ICD-10-CM | POA: Diagnosis not present

## 2023-09-03 NOTE — Progress Notes (Signed)
 [facility]  PATIENT: Diana [name] DOB: [birth year]  REQUESTING CLINICIAN: Bonner Ade, MD HISTORY FROM: Patient/Husband  REASON FOR VISIT: Bilateral hand numbness/tingling    HISTORICAL  CHIEF COMPLAINT:  Chief Complaint  Patient presents with   Consult    Pt in room 12. Husband in room. Paper referral for numbness and tingling in both hands.    INTERVAL HISTORY 09/03/2023 Patient presents today for follow-up, last visit was in February,.  At that time she was complaining of memory loss with a normal exam.  MRI brain was completed and did not show any acute abnormality.  She tells me today she was referred back by Dr. Bonner due to ongoing bilateral hands numbness and tingling.  She tells me the bilateral hands numbness has been going on for many years, left worse than right.  At 1 point she was told that she had carpal tunnel and was recommended a wrist brace.  Denies any pain but tells me in the morning, she had difficulty using her hands and fingers due to numbness and tingling.   HISTORY OF PRESENT ILLNESS:  This is 78 year old woman past with multiple medical conditions including hypertension, hyperlipidemia, diabetes, hypothyroidism, sleep apnea, heart and lung disease who was referred by PCP for memory problem.  Patient tells me she does not think that she has a memory problem, she feels like she had a silent stroke in the summer of 2020.  She could not give me any detail around the stroke, denies any focal deficit at that time but felt having a silent stroke in 2020.  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, then after retiring she worked with [employer] but during COVID she has stopped working and completely isolated herself.  Patient sister also died during COVID.  She developed back pain after driving a few days back and forth to visit her brother in [place] .  Husband tells me that her short-term memory is  affected but patient tells me that sometimes she does not pay attention because it is not important to her or she will just write things down and not worry about it because she can find the information.  They report multiple falls, last fall was Saturday.  She does use a walker with ambulation.   TBI:   No past history of TBI Stroke:   no past history of stroke Seizures:   no past history of seizures Sleep:   Yes, has a history of sleep apnea.  Mood:   patient denies anxiety and depression Family history of Dementia: Not sure, adopted at birth  Functional status: independent in all ADLs and IADLs Patient lives with husband. Cooking: no issues Cleaning: no issues Shopping: no issues Bathing: no issues Toileting:  no issues  Ever left the stove on by accident?: denies Forget how to use items around the house?: denies Getting lost going to familiar places?: denies Forgetting loved ones names?: denies Word finding difficulty? denies Sleep: no issues    OTHER MEDICAL CONDITIONS: Heart disease, lung disease, hypertension, hyperlipidemia, hypothyroidism, diabetes, sleep apnea   REVIEW OF SYSTEMS: Full 14 system review of systems performed and negative with exception of: As noted in the HPI   ALLERGIES: Allergies  Allergen Reactions   Molds & Smuts Shortness Of Breath   Other Shortness Of Breath    CAT   Pollen Extract Shortness Of Breath    Corn Pollen    HOME MEDICATIONS:  Outpatient Medications Prior to Visit  Medication Sig Dispense Refill   albuterol  (VENTOLIN  HFA) 108 (90 Base) MCG/ACT inhaler INHALE 2 PUFFS INTO THE LUNGS EVERY 6 HOURS AS NEEDED FOR WHEEZING OR SHORTNESS OF BREATH 26.8 g 2   allopurinol (ZYLOPRIM) 300 MG tablet Take 300 mg by mouth daily.     aspirin EC 81 MG tablet Take 81 mg by mouth daily.     budesonide -formoterol  (SYMBICORT ) 160-4.5 MCG/ACT inhaler INHALE 2 PUFFS INTO THE LUNGS TWICE DAILY 30.6 g 1   CALCIUM-MAGNESIUM PO Take 1 tablet by mouth  daily.     cholecalciferol (VITAMIN D) 1000 units tablet Take 1,000 Units by mouth daily.     diphenhydrAMINE (BENADRYL) 25 mg capsule Take 25 mg by mouth every 6 (six) hours as needed for allergies.     EPINEPHrine 0.3 mg/0.3 mL IJ SOAJ injection Inject 0.3 mg into the muscle as needed (anaphylaxis).     Estradiol 10 MCG TABS vaginal tablet Place vaginally.     fluticasone  (FLONASE ) 50 MCG/ACT nasal spray SHAKE LIQUID AND USE 2 SPRAYS IN EACH NOSTRIL EVERY DAY AS NEEDED 16 g 3   furosemide (LASIX) 20 MG tablet Take 20 mg by mouth daily.     ipratropium-albuterol  (DUONEB) 0.5-2.5 (3) MG/3ML SOLN Take 3 mLs by nebulization every 6 (six) hours as needed (shortness of breath). 360 mL 3   levothyroxine (SYNTHROID) 50 MCG tablet TAKE 1 TABLET BY MOUTH DAILY     metFORMIN (GLUCOPHAGE) 500 MG tablet Take 500 mg by mouth 2 (two) times daily with a meal.     montelukast  (SINGULAIR ) 10 MG tablet Take 1 tablet (10 mg total) by mouth at bedtime. 30 tablet 3   Multiple Vitamins-Minerals (MULTIVITAMIN WITH MINERALS) tablet Take 1 tablet by mouth daily.     OMEGA-3 FATTY ACIDS PO Take 1 capsule by mouth daily.     potassium chloride SA (K-DUR,KLOR-CON) 20 MEQ tablet Take 20 mEq by mouth daily.     simvastatin (ZOCOR) 20 MG tablet TK 1 T PO HS  1   temazepam (RESTORIL) 30 MG capsule 30 mg at bedtime as needed.     theophylline  (UNIPHYL) 400 MG 24 hr tablet TAKE 2 TABLETS BY MOUTH EVERY DAY (Patient taking differently: Take 800 mg by mouth daily. 1 po daily) 180 tablet 4   Tiotropium Bromide  Monohydrate (SPIRIVA  RESPIMAT) 1.25 MCG/ACT AERS INHALE 2 PUFFS INTO THE LUNGS DAILY 12 g 3   vitamin B-12 (CYANOCOBALAMIN) 1000 MCG tablet Take 1,000 mcg by mouth daily.     vitamin E 400 UNIT capsule Take 400 Units by mouth daily.     cetirizine (ZYRTEC) 10 MG tablet Take 10 mg by mouth daily. (Patient not taking: Reported on 09/03/2023)     Lotilaner (XDEMVY) 0.25 % SOLN Apply 25 mLs to eye once. (Patient not taking:  Reported on 09/03/2023)     thiamine (VITAMIN B-1) 50 MG tablet Take 50 mg by mouth daily. (Patient not taking: Reported on 09/03/2023)     No facility-administered medications prior to visit.    PAST MEDICAL HISTORY: Past Medical History:  Diagnosis Date   Allergic rhinitis    Asthma    COPD (chronic obstructive pulmonary disease) (HCC)    Gout    Left bundle branch block    Sleep apnea    Umbilical hernia     PAST SURGICAL HISTORY: Past Surgical History:  Procedure Laterality Date   ADENOIDECTOMY     BREAST LUMPECTOMY Right    CARDIAC  CATHETERIZATION     CATARACT EXTRACTION, BILATERAL     ROOT CANAL     SKIN BIOPSY     TONSILLECTOMY     WISDOM TOOTH EXTRACTION      FAMILY HISTORY: Family History  Problem Relation Age of Onset   Heart disease Mother    Stroke Brother     SOCIAL HISTORY: Social History   Socioeconomic History   Marital status: Married    Spouse name: Not on file   Number of children: Not on file   Years of education: Not on file   Highest education level: Not on file  Occupational History   Not on file  Tobacco Use   Smoking status: Never   Smokeless tobacco: Never  Vaping Use   Vaping status: Never Used  Substance and Sexual Activity   Alcohol use: No   Drug use: No   Sexual activity: Not on file  Other Topics Concern   Not on file  Social History Narrative   Not on file   Social Drivers of Health   Financial Resource Strain: Low Risk  (06/07/2023)   Received from [facility]   Overall Financial Resource Strain (CARDIA)    Difficulty of Paying Living Expenses: Not hard at all  Food Insecurity: No Food Insecurity (06/07/2023)   Received from [facility]   Hunger Vital Sign    Within the past 12 months, you worried that your food would run out before you got the money to buy more.: Never true    Within the past 12 months, the food you bought just didn't last and you didn't have money to get more.: Never true  Transportation  Needs: No Transportation Needs (06/07/2023)   Received from [facility] - Transportation    Lack of Transportation (Medical): No    Lack of Transportation (Non-Medical): No  Physical Activity: Unknown (06/07/2023)   Received from [facility]   Exercise Vital Sign    On average, how many days per week do you engage in moderate to strenuous exercise (like a brisk walk)?: 0 days    Minutes of Exercise per Session: Not on file  Stress: No Stress Concern Present (06/07/2023)   Received from [facility] - Occupational Stress Questionnaire    Feeling of Stress : Not at all  Social Connections: Socially Integrated (06/07/2023)   Received from [facility]   Social Network    How would you rate your social network (family, work, friends)?: Good participation with social networks  Intimate Partner Violence: Not At Risk (06/09/2023)   Received from [facility]   HITS    Over the last 12 months how often did your partner physically hurt you?: Never    Over the last 12 months how often did your partner insult you or talk down to you?: Never    Over the last 12 months how often did your partner threaten you with physical harm?: Never    Over the last 12 months how often did your partner scream or curse at you?: Never    PHYSICAL EXAM  GENERAL EXAM/CONSTITUTIONAL: Vitals:  Vitals:   09/03/23 1431  BP: 106/61  Pulse: 74  Weight: 138 lb 8 oz (62.8 kg)  Height: 5' 1 (1.549 m)    Body mass index is 26.17 kg/m. Wt Readings from Last 3 Encounters:  09/03/23 138 lb 8 oz (62.8 kg)  06/03/23 144 lb (65.3 kg)  04/22/23 142 lb  9.6 oz (64.7 kg)   Patient is in no distress; well developed, nourished and groomed; neck is supple  MUSCULOSKELETAL: Gait, strength, tone, movements noted in Neurologic exam below  NEUROLOGIC: MENTAL STATUS:      No data to display         awake, alert, oriented to person, place and time recent and  remote memory intact normal attention and concentration language fluent, comprehension intact, naming intact fund of knowledge appropriate     03/17/2023    1:38 PM  [facility] Cognitive Assessment   Visuospatial/ Executive (0/5) 5  Naming (0/3) 3  Attention: Read list of digits (0/2) 2  Attention: Read list of letters (0/1) 1  Attention: Serial 7 subtraction starting at 100 (0/3) 2  Language: Repeat phrase (0/2) 2  Language : Fluency (0/1) 1  Abstraction (0/2) 2  Delayed Recall (0/5) 2  Orientation (0/6) 6  Total 26  Adjusted Score (based on education) 26    CRANIAL NERVE:  2nd, 3rd, 4th, 6th- visual fields full to confrontation, extraocular muscles intact, no nystagmus 5th - facial sensation symmetric 7th - facial strength symmetric 8th - hearing intact 9th - palate elevates symmetrically, uvula midline 11th - shoulder shrug symmetric 12th - tongue protrusion midline  MOTOR:  full strength in the BUE, BLE. There is atrophy in both thenar aspects of the hand left worse then right   SENSORY:  normal and symmetric to light touch  COORDINATION:  finger-nose-finger, fine finger movements normal  GAIT/STATION:  Ambulates with a walker    DIAGNOSTIC DATA (LABS, IMAGING, TESTING) - I reviewed patient records, labs, notes, testing and imaging myself where available.  Lab Results  Component Value Date   WBC 8.5 11/24/2018   HGB 15.0 11/24/2018   HCT 44.1 11/24/2018   MCV 100.5 (H) 11/24/2018   PLT 259 11/24/2018      Component Value Date/Time   NA 143 04/22/2023 1447   K 4.5 04/22/2023 1447   CL 103 04/22/2023 1447   CO2 25 04/22/2023 1447   GLUCOSE 153 (H) 04/22/2023 1447   GLUCOSE 141 (H) 11/24/2018 1309   BUN 24 04/22/2023 1447   CREATININE 1.04 (H) 04/22/2023 1447   CREATININE 0.91 11/24/2018 1309   CALCIUM 10.4 (H) 04/22/2023 1447   PROT 6.0 (L) 07/27/2017 1543   ALBUMIN 3.8 07/27/2017 1543   AST 25 07/27/2017 1543   ALT 30 07/27/2017 1543   ALKPHOS  75 07/27/2017 1543   BILITOT 0.8 07/27/2017 1543   GFRNONAA >60 07/27/2017 1543   GFRAA >60 07/27/2017 1543   No results found for: CHOL, HDL, LDLCALC, LDLDIRECT, TRIG, CHOLHDL No results found for: YHAJ8R No results found for: VITAMINB12 No results found for: TSH  MRI Brain 04/04/2023 -Mild periventricular subcortical foci of chronic small vessel ischemic disease. -Chronic right maxillary sinusitis. -No acute findings   ASSESSMENT AND PLAN  78 y.o. year old female with hypertension, hyperlipidemia, diabetes, hypothyroidism, sleep apnea, heart and lung disease who presented with complaint of bilateral hands numbness for many years.  She tells me she was diagnosed with carpal tunnel syndrome, and was recommended a wrist brace.  I have informed patient that I suspect that her condition may be related to carpal tunnel syndrome, left worse than right.  Will confirm the diagnosis with EMG nerve conduction study, and based on severity will recommend surgical evaluation.  For now I have recommended bilateral wrist brace.  She voiced understanding.  Continue to follow with PCP, I will contact you  after completion of the EMG and return sooner if worse.    1. Bilateral carpal tunnel syndrome      Patient Instructions  EMG nerve conduction study  I will contact you to go over the results  Please obtain a wrist brace for the right  Return sooner if worse   Orders Placed This Encounter  Procedures   NCV with EMG(electromyography)    No orders of the defined types were placed in this encounter.   Return if symptoms worsen or fail to improve.    Pastor Falling, MD 09/03/2023, 3:45 PM  [facility] [address] [place], [place] [address] [phone]

## 2023-09-03 NOTE — Patient Instructions (Signed)
 EMG nerve conduction study  I will contact you to go over the results  Please obtain a wrist brace for the right  Return sooner if worse

## 2023-10-13 ENCOUNTER — Encounter: Payer: Self-pay | Admitting: Psychology

## 2023-10-23 ENCOUNTER — Encounter: Admitting: Neurology

## 2023-12-23 NOTE — Progress Notes (Signed)
 [facility] - Clinic Note  01/06/2024   CHIEF COMPLAINT Patient presents for No chief complaint on file.  HISTORY OF PRESENT ILLNESS: Diana [name] is a 78 y.o. female who presents to the clinic today for:   Pt is about to start Xdemvy per Dr. Kennyth, she states she is using brominidine and had punctal plugs put in  Referring physician: Kennyth Cy RAMAN, DO [address] [place],  [place] [address]  HISTORICAL INFORMATION:  Selected notes from the MEDICAL RECORD NUMBER Referred by Dr. Kennyth for ERM w/ cystic changes OU LEE:  Ocular Hx- PMH-   CURRENT MEDICATIONS: Current Outpatient Medications (Ophthalmic Drugs)  Medication Sig   Lotilaner (XDEMVY) 0.25 % SOLN Apply 25 mLs to eye once. (Patient not taking: Reported on 09/03/2023)   No current facility-administered medications for this visit. (Ophthalmic Drugs)   Current Outpatient Medications (Other)  Medication Sig   albuterol  (VENTOLIN  HFA) 108 (90 Base) MCG/ACT inhaler INHALE 2 PUFFS INTO THE LUNGS EVERY 6 HOURS AS NEEDED FOR WHEEZING OR SHORTNESS OF BREATH   allopurinol (ZYLOPRIM) 300 MG tablet Take 300 mg by mouth daily.   aspirin EC 81 MG tablet Take 81 mg by mouth daily.   budesonide -formoterol  (SYMBICORT ) 160-4.5 MCG/ACT inhaler INHALE 2 PUFFS INTO THE LUNGS TWICE DAILY   CALCIUM-MAGNESIUM PO Take 1 tablet by mouth daily.   cetirizine (ZYRTEC) 10 MG tablet Take 10 mg by mouth daily. (Patient not taking: Reported on 09/03/2023)   cholecalciferol (VITAMIN D ) 1000 units tablet Take 1,000 Units by mouth daily.   diphenhydrAMINE (BENADRYL) 25 mg capsule Take 25 mg by mouth every 6 (six) hours as needed for allergies.   EPINEPHrine 0.3 mg/0.3 mL IJ SOAJ injection Inject 0.3 mg into the muscle as needed (anaphylaxis).   Estradiol 10 MCG TABS vaginal tablet Place vaginally.   fluticasone  (FLONASE ) 50 MCG/ACT nasal spray SHAKE LIQUID AND USE 2 SPRAYS IN EACH NOSTRIL EVERY DAY AS NEEDED   furosemide (LASIX)  20 MG tablet Take 20 mg by mouth daily.   ipratropium-albuterol  (DUONEB) 0.5-2.5 (3) MG/3ML SOLN Take 3 mLs by nebulization every 6 (six) hours as needed (shortness of breath).   levothyroxine (SYNTHROID) 50 MCG tablet TAKE 1 TABLET BY MOUTH DAILY   metFORMIN (GLUCOPHAGE) 500 MG tablet Take 500 mg by mouth 2 (two) times daily with a meal.   montelukast  (SINGULAIR ) 10 MG tablet Take 1 tablet (10 mg total) by mouth at bedtime.   Multiple Vitamins-Minerals (MULTIVITAMIN WITH MINERALS) tablet Take 1 tablet by mouth daily.   OMEGA-3 FATTY ACIDS PO Take 1 capsule by mouth daily.   potassium chloride SA (K-DUR,KLOR-CON) 20 MEQ tablet Take 20 mEq by mouth daily.   simvastatin (ZOCOR) 20 MG tablet TK 1 T PO HS   temazepam (RESTORIL) 30 MG capsule 30 mg at bedtime as needed.   theophylline  (UNIPHYL) 400 MG 24 hr tablet TAKE 2 TABLETS BY MOUTH EVERY DAY (Patient taking differently: Take 800 mg by mouth daily. 1 po daily)   thiamine (VITAMIN B-1) 50 MG tablet Take 50 mg by mouth daily. (Patient not taking: Reported on 09/03/2023)   Tiotropium Bromide  Monohydrate (SPIRIVA  RESPIMAT) 1.25 MCG/ACT AERS INHALE 2 PUFFS INTO THE LUNGS DAILY   vitamin B-12 (CYANOCOBALAMIN) 1000 MCG tablet Take 1,000 mcg by mouth daily.   vitamin E 400 UNIT capsule Take 400 Units by mouth daily.   No current facility-administered medications for this visit. (Other)   REVIEW OF SYSTEMS:   ALLERGIES Allergies  Allergen Reactions   Molds & Smuts Shortness Of Breath   Other Shortness Of Breath    CAT   Pollen Extract Shortness Of Breath    Corn Pollen   PAST MEDICAL HISTORY Past Medical History:  Diagnosis Date   Allergic rhinitis    Asthma    COPD (chronic obstructive pulmonary disease) (HCC)    Gout    Left bundle branch block    Sleep apnea    Umbilical hernia    Past Surgical History:  Procedure Laterality Date   ADENOIDECTOMY     BREAST LUMPECTOMY Right    CARDIAC CATHETERIZATION     CATARACT EXTRACTION,  BILATERAL     ROOT CANAL     SKIN BIOPSY     TONSILLECTOMY     WISDOM TOOTH EXTRACTION     FAMILY HISTORY Family History  Problem Relation Age of Onset   Heart disease Mother    Stroke Brother    SOCIAL HISTORY Social History   Tobacco Use   Smoking status: Never   Smokeless tobacco: Never  Vaping Use   Vaping status: Never Used  Substance Use Topics   Alcohol use: No   Drug use: No       OPHTHALMIC EXAM:  Not recorded    IMAGING AND PROCEDURES  Imaging and Procedures for 01/06/2024         ASSESSMENT/PLAN: No diagnosis found.  Epiretinal membrane, both eyes  - mild ERM w/ central cystic changes OU; OD with central foveal notch - BCVA 20/20 OU - OCT shows OD: Mild ERM with central cystic changes and central foveal notch--no significant change from prior. Scattered drusen, patchy ORA; OS: ERM with central cystic changes - FA (05.20.24) shows no central leakage or staining within central cysts - asymptomatic, no metamorphopsia - no indication for surgery at this time - monitor for now - recommended 9 month follow up, but pt prefers to stay at 6 months - f/u 6 mos -- DFE/OCT  2. Age related macular degeneration, non-exudative, both eyes  - intermediate stage  - The incidence, anatomy, and pathology of dry AMD, risk of progression, and the AREDS and AREDS 2 study including smoking risks discussed with patient.  - Recommend amsler grid monitoring  - monitor  3,4. 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   - The need for close monitoring of blood glucose, blood pressure, and serum lipids, avoiding cigarette or any type of tobacco, and the need for long term follow up was also discussed with patient. - f/u in 1 year, sooner prn  5. Pseudophakia OU  - s/p CE/IOL OU  - IOL in good position, doing well  -  monitor  Ophthalmic Meds Ordered this visit:  No orders of the defined types were placed in this encounter.    No follow-ups on file.  There are no Patient Instructions on file for this visit.  Explained the diagnoses, plan, and follow up with the patient and they expressed understanding.  Patient expressed understanding of the importance of proper follow up care This document serves as a record of services personally performed by Redell JUDITHANN Hans, MD, PhD. It was created on their behalf by Wanda GEANNIE Keens, COT an ophthalmic technician. The creation of this record is the provider's dictation and/or activities during the visit.    Electronically signed by:  Wanda GEANNIE Keens, COT  12/23/23  10:03 AM   Redell JUDITHANN Hans, M.D., Ph.D. Diseases & Surgery of the Retina and Vitreous [facility] 01/06/2024    Abbreviations: M myopia (nearsighted); A astigmatism; H hyperopia (farsighted); P presbyopia; Mrx spectacle prescription;  CTL contact lenses; OD right eye; OS left eye; OU both eyes  XT exotropia; ET esotropia; PEK punctate epithelial keratitis; PEE punctate epithelial erosions; DES dry eye syndrome; MGD meibomian gland dysfunction; ATs artificial tears; PFAT's preservative free artificial tears; NSC nuclear sclerotic cataract; PSC posterior subcapsular cataract; ERM epi-retinal membrane; PVD posterior vitreous detachment; RD retinal detachment; DM diabetes mellitus; DR diabetic retinopathy; NPDR non-proliferative diabetic retinopathy; PDR proliferative diabetic retinopathy; CSME clinically significant macular edema; DME diabetic macular edema; dbh dot blot hemorrhages; CWS cotton wool spot; POAG primary open angle glaucoma; C/D cup-to-disc ratio; HVF humphrey visual field; GVF goldmann visual field; OCT optical coherence tomography; IOP intraocular pressure; BRVO Branch retinal vein occlusion; CRVO central retinal vein occlusion; CRAO central retinal artery occlusion;  BRAO branch retinal artery occlusion; RT retinal tear; SB scleral buckle; PPV pars plana vitrectomy; VH Vitreous hemorrhage; PRP panretinal laser photocoagulation; IVK intravitreal kenalog; VMT vitreomacular traction; MH Macular hole;  NVD neovascularization of the disc; NVE neovascularization elsewhere; AREDS age related eye disease study; ARMD age related macular degeneration; POAG primary open angle glaucoma; EBMD epithelial/anterior basement membrane dystrophy; ACIOL anterior chamber intraocular lens; IOL intraocular lens; PCIOL posterior chamber intraocular lens; Phaco/IOL phacoemulsification with intraocular lens placement; PRK photorefractive keratectomy; LASIK laser assisted in situ keratomileusis; HTN hypertension; DM diabetes mellitus; COPD chronic obstructive pulmonary disease

## 2023-12-24 ENCOUNTER — Encounter: Admitting: Psychology

## 2023-12-24 DIAGNOSIS — R4189 Other symptoms and signs involving cognitive functions and awareness: Secondary | ICD-10-CM | POA: Diagnosis present
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
## 2023-12-25 ENCOUNTER — Encounter: Admitting: Neurology

## 2023-12-28 ENCOUNTER — Encounter: Payer: Self-pay | Admitting: Psychology

## 2024-01-06 ENCOUNTER — Ambulatory Visit (INDEPENDENT_AMBULATORY_CARE_PROVIDER_SITE_OTHER): Admitting: Ophthalmology

## 2024-01-06 ENCOUNTER — Encounter (INDEPENDENT_AMBULATORY_CARE_PROVIDER_SITE_OTHER): Payer: Self-pay | Admitting: Ophthalmology

## 2024-01-06 DIAGNOSIS — Z961 Presence of intraocular lens: Secondary | ICD-10-CM

## 2024-01-06 DIAGNOSIS — H35373 Puckering of macula, bilateral: Secondary | ICD-10-CM

## 2024-01-06 DIAGNOSIS — H353132 Nonexudative age-related macular degeneration, bilateral, intermediate dry stage: Secondary | ICD-10-CM | POA: Diagnosis not present

## 2024-01-06 DIAGNOSIS — Z7984 Long term (current) use of oral hypoglycemic drugs: Secondary | ICD-10-CM | POA: Diagnosis not present

## 2024-01-06 DIAGNOSIS — E119 Type 2 diabetes mellitus without complications: Secondary | ICD-10-CM

## 2024-01-07 ENCOUNTER — Ambulatory Visit: Admitting: Neurology

## 2024-01-07 VITALS — BP 105/58

## 2024-01-07 DIAGNOSIS — G629 Polyneuropathy, unspecified: Secondary | ICD-10-CM | POA: Insufficient documentation

## 2024-01-07 DIAGNOSIS — G6289 Other specified polyneuropathies: Secondary | ICD-10-CM | POA: Diagnosis not present

## 2024-01-07 DIAGNOSIS — R269 Unspecified abnormalities of gait and mobility: Secondary | ICD-10-CM | POA: Insufficient documentation

## 2024-01-07 DIAGNOSIS — G5603 Carpal tunnel syndrome, bilateral upper limbs: Secondary | ICD-10-CM | POA: Diagnosis not present

## 2024-01-07 NOTE — Procedures (Unsigned)
 Full Name: Diana [name] Gender: Female MRN #: [id number] Date of Birth: 07-16-1945    Visit Date: 01/07/2024 11:13 Age: 78 Years Examining Physician: Onita Duos Referring Physician: Gregg Height: 5 feet 1 inch    Conclusion:     ------------------------------- Physician Name, M.D.  [facility] [address] [place], [place] [address] Tel: [phone] Fax: [phone]  Verbal informed consent was obtained from the patient, patient was informed of potential risk of procedure, including bruising, bleeding, hematoma formation, infection, muscle weakness, muscle pain, numbness, among others.        MNC    Nerve / Sites Muscle Latency Ref. Amplitude Ref. Rel Amp Segments Distance Velocity Ref. Area    ms ms mV mV %  cm m/s m/s mVms  R Median - APB     Wrist APB 6.3 <=4.4 0.9 >=4.0 100 Wrist - APB 7   3.1     Upper arm APB 10.2  1.2  133 Upper arm - Wrist 20 51 >=49 4.3  L Median - APB     Wrist APB 6.8 <=4.4 2.6 >=4.0 100 Wrist - APB 7   8.3     Upper arm APB 10.5  2.2  83.7 Upper arm - Wrist 19 52 >=49 7.3  R Ulnar - ADM     Wrist ADM 3.3 <=3.3 6.2 >=6.0 100 Wrist - ADM 7   14.5     B.Elbow ADM 5.5  6.8  109 B.Elbow - Wrist 13 58 >=49 15.8     A.Elbow ADM 7.6  7.0  103 A.Elbow - B.Elbow 12 59 >=49 17.5  L Ulnar - ADM     Wrist ADM 3.1 <=3.3 7.1 >=6.0 100 Wrist - ADM 7   20.3     B.Elbow ADM 4.8  7.8  111 B.Elbow - Wrist 10 59 >=49 20.3     A.Elbow ADM 6.8  7.6  97.1 A.Elbow - B.Elbow 12 59 >=49 21.1  L Tibial - AH     Ankle AH 5.5 <=5.8 1.8 >=4.0 100 Ankle - AH 9   2.6     Pop fossa AH 14.1  2.0  114 Pop fossa - Ankle 41 48 >=41 4.1               SNC    Nerve / Sites Rec. Site Peak Lat Ref.  Amp Ref. Segments Distance    ms ms V V  cm  R Radial - Anatomical snuff box (Forearm)     Forearm Wrist 2.1 <=2.9 15 >=15 Forearm - Wrist 10  L Radial - Anatomical snuff box (Forearm)     Forearm Wrist 2.1 <=2.9 17 >=15 Forearm - Wrist  10  L Sural - Ankle (Calf)     Calf Ankle NR <=4.4 NR >=6 Calf - Ankle 14  R Median - Orthodromic (Dig II, Mid palm)     Dig II Wrist NR <=3.4 NR >=10 Dig II - Wrist 13  L Median - Orthodromic (Dig II, Mid palm)     Dig II Wrist NR <=3.4 NR >=10 Dig II - Wrist 13  R Ulnar - Orthodromic, (Dig V, Mid palm)     Dig V Wrist 2.9 <=3.1 5 >=5 Dig V - Wrist 11  L Ulnar - Orthodromic, (Dig V, Mid palm)     Dig V Wrist 3.1 <=3.1 6 >=5 Dig V - Wrist 11  F  Wave    Nerve F Lat Ref.   ms ms  R Ulnar - ADM 25.6 <=32.0  L Ulnar - ADM 26.6 <=32.0  L Tibial - AH 48.5 <=56.0           EMG Summary Table    Spontaneous MUAP Recruitment  Muscle IA Fib PSW Fasc Other Amp Dur. Poly Pattern  R. Abductor pollicis brevis Increased None None None _______ Normal Normal Normal Reduced  L. Abductor pollicis brevis Increased None None None _______ Normal Normal Normal Discrete  L. First dorsal interosseous Normal None None None _______ Normal Normal Normal Normal  L. Pronator teres Normal None None None _______ Normal Normal Normal Normal  L. Triceps brachii Normal None None None _______ Normal Normal Normal Normal  L. Deltoid Normal None None None _______ Normal Normal Normal Normal  L. Biceps brachii Normal None None None _______ Normal Normal Normal Normal  L. Extensor digitorum communis Normal None None None _______ Normal Normal Normal Normal  L. Cervical paraspinals Normal None None None _______ Normal Normal Normal Normal  L. Tibialis anterior Increased 1+ None None _______ Increased Increased 1+ Reduced  L. Tibialis posterior Increased 1+ None None _______ Increased Increased 1+ Reduced  L. Peroneus longus Normal None None None _______ Increased Increased 1+ Reduced  L. Gastrocnemius (Medial head) Normal None None None _______ Normal Normal Normal Reduced  L. Vastus lateralis Normal None None None _______ Normal Normal Normal Reduced  L. Lumbar paraspinals (mid) Increased 1+ None None  _______ Normal Normal Normal Normal  L. Lumbar paraspinals (low) Increased 1+ None None _______ Normal Normal Normal Normal  L. Iliopsoas Normal None None None _______ Normal Normal Normal Normal

## 2024-01-07 NOTE — Progress Notes (Unsigned)
 Chief Complaint  Patient presents with   Follow-up    Pt in EMG room 3.  Husband in room. Here for NCS.      ASSESSMENT AND PLAN  Diana [name] is a 78 y.o. female     DIAGNOSTIC DATA (LABS, IMAGING, TESTING) - I reviewed patient records, labs, notes, testing and imaging myself where available.   MEDICAL HISTORY:  Diana [name], seen in request by   Beam, Lamar POUR, MD [address] [place],  [place] [address], Beam, Lamar POUR, MD   History is obtained from the patient and review of electronic medical records. I personally reviewed pertinent available imaging films in PACS.   PMHx of   Past Medical History:  Diagnosis Date   Allergic rhinitis    Asthma    COPD (chronic obstructive pulmonary disease) (HCC)    Gout    Left bundle branch block    Sleep apnea    Umbilical hernia    Past Surgical History:  Procedure Laterality Date   ADENOIDECTOMY     BREAST LUMPECTOMY Right    CARDIAC CATHETERIZATION     CATARACT EXTRACTION, BILATERAL     ROOT CANAL     SKIN BIOPSY     TONSILLECTOMY     WISDOM TOOTH EXTRACTION       PHYSICAL EXAM:   Vitals:   01/07/24 1037  BP: (!) 105/58   Not recorded     There is no height or weight on file to calculate BMI.  PHYSICAL EXAMNIATION:  Gen: NAD, conversant, well nourised, well groomed                     Cardiovascular: Regular rate rhythm, no peripheral edema, warm, nontender. Eyes: Conjunctivae clear without exudates or hemorrhage Neck: Supple, no carotid bruits. Pulmonary: Clear to auscultation bilaterally   NEUROLOGICAL EXAM:  MENTAL STATUS: Speech/cognition: Awake, alert, oriented to history taking and casual conversation CRANIAL NERVES: CN II: Visual fields are full to confrontation. Pupils are round equal and briskly reactive to light. CN III, IV, VI: extraocular movement are normal. No ptosis. CN V: Facial sensation is intact to light touch CN VII: Face is symmetric with normal eye  closure  CN VIII: Hearing is normal to causal conversation. CN IX, X: Phonation is normal. CN XI: Head turning and shoulder shrug are intact  MOTOR: There is no pronator drift of out-stretched arms. Muscle bulk and tone are normal. Muscle strength is normal.  REFLEXES: Reflexes are 2+ and symmetric at the biceps, triceps, knees, and ankles. Plantar responses are flexor.  SENSORY: Intact to light touch, pinprick and vibratory sensation are intact in fingers and toes.  COORDINATION: There is no trunk or limb dysmetria noted.  GAIT/STANCE: Posture is normal. Gait is steady with normal steps, base, arm swing, and turning. Heel and toe walking are normal. Tandem gait is normal.  Romberg is absent.  REVIEW OF SYSTEMS:  Full 14 system review of systems performed and notable only for as above All other review of systems were negative.   ALLERGIES: Allergies[1]  HOME MEDICATIONS: Current Outpatient Medications  Medication Sig Dispense Refill   albuterol  (VENTOLIN  HFA) 108 (90 Base) MCG/ACT inhaler INHALE 2 PUFFS INTO THE LUNGS EVERY 6 HOURS AS NEEDED FOR WHEEZING OR SHORTNESS OF BREATH 26.8 g 2   allopurinol (ZYLOPRIM) 300 MG tablet Take 300 mg by mouth daily.     aspirin EC 81 MG tablet Take 81 mg by mouth  daily.     budesonide -formoterol  (SYMBICORT ) 160-4.5 MCG/ACT inhaler INHALE 2 PUFFS INTO THE LUNGS TWICE DAILY 30.6 g 1   CALCIUM-MAGNESIUM PO Take 1 tablet by mouth daily.     cetirizine (ZYRTEC) 10 MG tablet Take 10 mg by mouth daily. (Patient not taking: Reported on 09/03/2023)     cholecalciferol (VITAMIN D ) 1000 units tablet Take 1,000 Units by mouth daily.     diphenhydrAMINE (BENADRYL) 25 mg capsule Take 25 mg by mouth every 6 (six) hours as needed for allergies.     EPINEPHrine 0.3 mg/0.3 mL IJ SOAJ injection Inject 0.3 mg into the muscle as needed (anaphylaxis).     Estradiol 10 MCG TABS vaginal tablet Place vaginally.     fluticasone  (FLONASE ) 50 MCG/ACT nasal spray SHAKE  LIQUID AND USE 2 SPRAYS IN EACH NOSTRIL EVERY DAY AS NEEDED 16 g 3   furosemide (LASIX) 20 MG tablet Take 20 mg by mouth daily.     ipratropium-albuterol  (DUONEB) 0.5-2.5 (3) MG/3ML SOLN Take 3 mLs by nebulization every 6 (six) hours as needed (shortness of breath). 360 mL 3   levothyroxine (SYNTHROID) 50 MCG tablet TAKE 1 TABLET BY MOUTH DAILY     Lotilaner (XDEMVY) 0.25 % SOLN Apply 25 mLs to eye once. (Patient not taking: Reported on 09/03/2023)     metFORMIN (GLUCOPHAGE) 500 MG tablet Take 500 mg by mouth 2 (two) times daily with a meal.     montelukast  (SINGULAIR ) 10 MG tablet Take 1 tablet (10 mg total) by mouth at bedtime. 30 tablet 3   Multiple Vitamins-Minerals (MULTIVITAMIN WITH MINERALS) tablet Take 1 tablet by mouth daily.     OMEGA-3 FATTY ACIDS PO Take 1 capsule by mouth daily.     potassium chloride SA (K-DUR,KLOR-CON) 20 MEQ tablet Take 20 mEq by mouth daily.     simvastatin (ZOCOR) 20 MG tablet TK 1 T PO HS  1   temazepam (RESTORIL) 30 MG capsule 30 mg at bedtime as needed.     theophylline  (UNIPHYL) 400 MG 24 hr tablet TAKE 2 TABLETS BY MOUTH EVERY DAY (Patient taking differently: Take 800 mg by mouth daily. 1 po daily) 180 tablet 4   thiamine (VITAMIN B-1) 50 MG tablet Take 50 mg by mouth daily. (Patient not taking: Reported on 09/03/2023)     Tiotropium Bromide  Monohydrate (SPIRIVA  RESPIMAT) 1.25 MCG/ACT AERS INHALE 2 PUFFS INTO THE LUNGS DAILY 12 g 3   vitamin B-12 (CYANOCOBALAMIN) 1000 MCG tablet Take 1,000 mcg by mouth daily.     vitamin E 400 UNIT capsule Take 400 Units by mouth daily.     No current facility-administered medications for this visit.    PAST MEDICAL HISTORY: Past Medical History:  Diagnosis Date   Allergic rhinitis    Asthma    COPD (chronic obstructive pulmonary disease) (HCC)    Gout    Left bundle branch block    Sleep apnea    Umbilical hernia     PAST SURGICAL HISTORY: Past Surgical History:  Procedure Laterality Date   ADENOIDECTOMY      BREAST LUMPECTOMY Right    CARDIAC CATHETERIZATION     CATARACT EXTRACTION, BILATERAL     ROOT CANAL     SKIN BIOPSY     TONSILLECTOMY     WISDOM TOOTH EXTRACTION      FAMILY HISTORY: Family History  Problem Relation Age of Onset   Heart disease Mother    Stroke Brother     SOCIAL HISTORY: Social History  Socioeconomic History   Marital status: Married    Spouse name: Not on file   Number of children: Not on file   Years of education: Not on file   Highest education level: Not on file  Occupational History   Not on file  Tobacco Use   Smoking status: Never   Smokeless tobacco: Never  Vaping Use   Vaping status: Never Used  Substance and Sexual Activity   Alcohol use: No   Drug use: No   Sexual activity: Not on file  Other Topics Concern   Not on file  Social History Narrative   Not on file   Social Drivers of Health   Tobacco Use: Low Risk (01/06/2024)   Patient History    Smoking Tobacco Use: Never    Smokeless Tobacco Use: Never    Passive Exposure: Not on file  Financial Resource Strain: Low Risk (10/06/2023)   Received from [facility]   Overall Financial Resource Strain (CARDIA)    How hard is it for you to pay for the very basics like food, housing, medical care, and heating?: Not hard at all  Food Insecurity: No Food Insecurity (10/06/2023)   Received from [facility]   Epic    Within the past 12 months, you worried that your food would run out before you got the money to buy more.: Never true    Within the past 12 months, the food you bought just didn't last and you didn't have money to get more.: Never true  Transportation Needs: No Transportation Needs (10/06/2023)   Received from [facility]    In the past 12 months, has lack of transportation kept you from medical appointments or from getting medications?: No    In the past 12 months, has lack of transportation kept you from meetings, work, or from getting things needed for daily  living?: No  Physical Activity: Inactive (10/06/2023)   Received from [facility]   Exercise Vital Sign    On average, how many days per week do you engage in moderate to strenuous exercise (like a brisk walk)?: 0 days    Minutes of Exercise per Session: Not on file  Stress: No Stress Concern Present (10/06/2023)   Received from [facility] - Occupational Stress Questionnaire    Do you feel stress - tense, restless, nervous, or anxious, or unable to sleep at night because your mind is troubled all the time - these days?: Not at all  Social Connections: Moderately Integrated (10/06/2023)   Received from [facility]   Social Network    How would you rate your social network (family, work, friends)?: Adequate participation with social networks  Intimate Partner Violence: Not At Risk (10/06/2023)   Received from [facility]   HITS    Over the last 12 months how often did your partner physically hurt you?: Never    Over the last 12 months how often did your partner insult you or talk down to you?: Never    Over the last 12 months how often did your partner threaten you with physical harm?: Never    Over the last 12 months how often did your partner scream or curse at you?: Never  Depression (PHQ2-9): Not on file  Alcohol Screen: Not on file  Housing: Unknown (10/06/2023)   Received from [facility]    In the last 12 months, was there a time when you  were not able to pay the mortgage or rent on time?: No    Number of Times Moved in the Last Year: Not on file    At any time in the past 12 months, were you homeless or living in a shelter (including now)?: No  Utilities: Not At Risk (10/06/2023)   Received from [facility]    In the past 12 months has the electric, gas, oil, or water company threatened to shut off services in your home?: No  Health Literacy: Not on file      Modena Callander, M.D. Ph.D.  [facility] [address] [place], [place] [address] Ph: [phone] Fax: [phone]  CC:  Beam, Lamar POUR, MD [address] [place],  [place] [address]  Beam, Lamar POUR, MD      [1]  Allergies Allergen Reactions   Molds & Smuts Shortness Of Breath   Other Shortness Of Breath    CAT   Pollen Extract Shortness Of Breath    Corn Pollen

## 2024-01-10 LAB — FOLATE: Folate: >20 ng/mL (ref >3.0)

## 2024-01-10 LAB — MULTIPLE MYELOMA PANEL, SERUM
Albumin SerPl Elph-Mcnc: 3.7 g/dL (ref 2.9–4.4)
Albumin/Glob SerPl: 1.7 (ref 0.7–1.7)
Alpha 1: 0.3 g/dL (ref 0.0–0.4)
Alpha2 Glob SerPl Elph-Mcnc: 0.9 g/dL (ref 0.4–1.0)
B-Globulin SerPl Elph-Mcnc: 0.8 g/dL (ref 0.7–1.3)
Gamma Glob SerPl Elph-Mcnc: 0.3 g/dL — AB (ref 0.4–1.8)
Globulin, Total: 2.3 g/dL (ref 2.2–3.9)
IgG (Immunoglobin G), Serum: 413 mg/dL — AB (ref 586–1602)
IgM (Immunoglobulin M), Srm: 35 mg/dL (ref 26–217)
Immunoglobulin A, (IgA) QN, Serum: 25 mg/dL — AB (ref 64–422)
Total Protein: 6 g/dL (ref 6.0–8.5)

## 2024-01-10 LAB — C-REACTIVE PROTEIN: CRP: 3 mg/L (ref 0–10)

## 2024-01-10 LAB — SYPHILIS: RPR W/REFLEX TO RPR TITER AND TREPONEMAL ANTIBODIES, TRADITIONAL SCREENING AND DIAGNOSIS ALGORITHM: RPR Ser Ql: NONREACTIVE

## 2024-01-10 LAB — VITAMIN D 25 HYDROXY (VIT D DEFICIENCY, FRACTURES): Vit D, 25-Hydroxy: 41.7 ng/mL (ref 30.0–100.0)

## 2024-01-10 LAB — SEDIMENTATION RATE: Sed Rate: 16 mm/h (ref 0–40)

## 2024-01-10 LAB — CK: Total CK: 38 U/L (ref 32–182)

## 2024-01-11 ENCOUNTER — Ambulatory Visit: Payer: Self-pay | Admitting: Neurology

## 2024-01-12 ENCOUNTER — Encounter (INDEPENDENT_AMBULATORY_CARE_PROVIDER_SITE_OTHER): Payer: Self-pay | Admitting: Ophthalmology

## 2024-01-15 ENCOUNTER — Ambulatory Visit: Payer: Self-pay | Admitting: Neurology

## 2024-01-15 NOTE — Progress Notes (Signed)
 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 Please advise her to continue wearing her wrist braces and ask her if she is interested in surgery to help with the compression of the nerves.   Dr. Colden Samaras

## 2024-01-20 ENCOUNTER — Encounter

## 2024-01-20 DIAGNOSIS — R4189 Other symptoms and signs involving cognitive functions and awareness: Secondary | ICD-10-CM | POA: Diagnosis not present

## 2024-01-20 NOTE — Progress Notes (Signed)
 "  Mental Status/Behavioral Observations (01/20/2024):  Orientation: The patient was oriented to self, place, and time. Sensory/Arousal: Hearing and vision were adequate for testing. The patient was alert. Appearance: Dress and hygiene were appropriate for the setting.  Speech/Language: In conversation, the patient's speech was prosodic, fluent, and well-articulated. The patient displayed no indications of word finding difficulties and no word substitution errors were observed.  Motor: The patient ambulated independently and without issue. No tremors were observed.  Social Comportment: Social behavior was appropriate for the setting. Mood/Affect: Mood was largely neutral. Affect was consistent with mood.  Attention/Concentration: The patient appeared to maintain consistent engagement throughout the testing session. No frank attentional lapses were observed.  Thought Process/Content: The patient's thought process was coherent, linear, goal directed. There were no indications of psychosis.  Additional Observations:  The patient showed no difficulties with understanding task instructions. Minimal difficulties with frustration tolerance were noted. The patient made frequent inquiries regarding her performance on the test.  Neuropsychology Note Diana [name] completed 120 minutes of neuropsychological testing with technician, Josue Ned, BA, under the supervision of Evalene Riff, PsyD., Clinical Neuropsychologist. The patient did not appear overtly distressed by the testing session, per behavioral observation or via self-report to the technician. Rest breaks were offered.   Clinical Decision Making: In considering the patient's current level of functioning, level of presumed impairment, nature of symptoms, emotional and behavioral responses during clinical interview, level of literacy, and observed level of motivation/effort, a battery of tests was selected by Dr. Riff during initial consultation  on 12/24/2023. This was communicated to the technician. Communication between the neuropsychologist and technician was ongoing throughout the testing session and changes were made as deemed necessary based on patient performance on testing, technician observations and additional pertinent factors such as those listed above.  Tests Administered: Automatic Data Edition (BNT-2) Brief Visuospatial Memory Test-Revised (BVMT-R) Clock Drawing Test Controlled Oral Word Association Test (FAS & Animals) Delis-Kaplan Executive Function System (D-KEFS), select subtests Hopkins Verbal Learning Test - Revised (HVLT-R) Repeatable Battery for the Assessment of Neuropsychological Status Update (RBANS) Trail Making Test (TMT; Part A & B) Wechsler Adult Intelligence Scale-Fourth Edition (WAIS-IV), select subtests Wechsler Memory Scale-Fourth Edition (WMS-IV) , select subtests Wechsler Memory Scale-Third Edition (WMS-III), select subtests  Wechsler Test of Adult Reading (WTAR) Geriatric Depression Scale-Short Form (GDS-SF) Geriatric Anxiety Inventory (GAI)  Results: Note: This summary of test scores accompanies the interpretive report and should not be interpreted by unqualified individuals or in isolation without reference to the report. Test scores are relative to age, gender, and educational history as available and appropriate. Measurement properties of test scores: IQ, Index, and Standard Scores (SS): Mean = 100; Standard Deviation = 15; Scaled Scores (ss): Mean = 10; Standard Deviation = 3; Z scores (Z): Mean = 0; Standard Deviation = 1; T scores (T); Mean = 50; Standard Deviation = 10  Intellectual/Premorbid Functioning Estimate   Norm Score Percentile  Range  Wechsler Test of Adult Reading  SS = 108 70 %ile Average   ATTENTION AND WORKING MEMORY    Norm Score Percentile  Range  WAIS-IV          Digit Span  ss = 9 37 %ile Average   DSF  ss = 11 63 %ile Average   Span:    7      DSB  ss = 7  16 %ile Low Average   Span:    3      DSS  ss =  10 50 %ile Average   Span:    4     WMS-III          Spatial Span  ss = 12 75 %ile High Average   SSF  ss = 12 75 %ile High Average   Span:    6      SSB  ss = 11 63 %ile Average   Span:    4      PROCESSING SPEED    Norm Score Percentile  Range  WAIS-IV          Coding  ss = 11 63 %ile Average   Symbol Search  ss = 13 84 %ile High Average   LANGUAGE    Norm Score Percentile  Range  Boston Naming Test (BNT-2)  T = 64 92 %ile Above Average  COWAT          FAS  T = 50 50 %ile Average   Animals  T = 33 5 %ile Below Average   EXECUTIVE FUNCTIONING    Norm Score Percentile  Range  DKEFS - Color-Word Interference          Color Naming  ss = 4 2 %ile Below Average   Word Reading  ss = 7 16 %ile Low Average   Inhibition  ss = 11 63 %ile Average   Errors  ss = 10 50 %ile Average   Inhibition Switching  ss = 5 5 %ile Below Average   Errors  ss = 8 25 %ile Average  TMT A  T = 54 66 %ile Average  TMT B   T = 57 75 %ile High Average   MEMORY    Norm Score Percentile  Range  BVMT-R          Trial 1  T = 42.0 21 %ile Low Average   Trial 2  T = 36 8 %ile Below Average   Trial 3  T = 31.0 3 %ile Below Average   Total Recall  T = 34.0 5 %ile Below Average   Learning  T = 35.0 7 %ile Below Average   Delayed Recall  T = 31.0 3 %ile Below Average   % Retained    75 >16 %ile WNL   Hits     >16 %ile WNL   False Alarms     <1 %ile Exceptionally Low   Recognition Discriminability     <1 %ile Exceptionally Low  HVLT          Total Recall  T = 27.0 1 %ile Exceptionally Low   Delayed Recall  T = 35 7 %ile Below Average   %Retention  T = 57.0 75 %ile High Average   Recognition Discriminability  T = <20 <0.1 %ile Exceptionally Low   Wechsler Memory Scale, 4th Edition (WMS-4)         Log. Mem. Immediate Recall  ss = 10 50 %ile Average   Logical Memory Delayed Recall  ss = 9 37 %ile Average   Logical Recognition    >75th  %ile High Average    VISUAL-SPATIAL    Norm Score Percentile  Range  Clock                   RBANS Visuospatial Index          RBANS Figure Copy  ss = 10 50 %ile Average   RBANS Line Orientation      26th-50th  %ile Average   PERSONALITY  AND BEHAVIORAL FUNCTIONING      Score/Interpretation  GDS-SF Raw       2  GDS-SF Severity       Minimal.  GAI Raw       9  GAI Severity       Minimal.    Feedback to Patient: Diana [name] will return on 02/05/2024 for an interactive feedback session with Dr. Hayden at which time her test performances, clinical impressions and treatment recommendations will be reviewed in detail. The patient understands she can contact our office should she require our assistance before this time.  120 minutes spent face-to-face with patient administering standardized tests, 60 minutes spent scoring radiographer, therapeutic). [CPT [id number], [id number]]  Full report to follow. "

## 2024-01-26 ENCOUNTER — Encounter: Attending: Psychology | Admitting: Psychology

## 2024-01-26 DIAGNOSIS — R4189 Other symptoms and signs involving cognitive functions and awareness: Secondary | ICD-10-CM | POA: Insufficient documentation

## 2024-02-05 ENCOUNTER — Encounter: Admitting: Psychology

## 2024-02-09 NOTE — Addendum Note (Signed)
 Addended byBETHA HAYDEN LYE on: 02/09/2024 09:38 AM   Modules accepted: Level of Service

## 2024-02-09 NOTE — Addendum Note (Signed)
 Addended byBETHA HAYDEN LYE on: 02/09/2024 09:32 AM   Modules accepted: Level of Service

## 2024-06-30 ENCOUNTER — Encounter (INDEPENDENT_AMBULATORY_CARE_PROVIDER_SITE_OTHER): Admitting: Ophthalmology
